# Patient Record
Sex: Female | Born: 1985 | Race: Black or African American | Hispanic: No | Marital: Single | Smoking: Former smoker
Health system: Southern US, Community
[De-identification: ages and names within clinical notes are randomized; demographics above are authoritative.]

## PROBLEM LIST (undated history)

## (undated) ENCOUNTER — Ambulatory Visit: Admission: EM | Payer: Self-pay

## (undated) ENCOUNTER — Inpatient Hospital Stay (HOSPITAL_COMMUNITY): Payer: Self-pay

## (undated) DIAGNOSIS — N760 Acute vaginitis: Secondary | ICD-10-CM

## (undated) DIAGNOSIS — D649 Anemia, unspecified: Secondary | ICD-10-CM

## (undated) DIAGNOSIS — B9689 Other specified bacterial agents as the cause of diseases classified elsewhere: Secondary | ICD-10-CM

## (undated) HISTORY — PX: NO PAST SURGERIES: SHX2092

---
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
## 2006-05-07 ENCOUNTER — Emergency Department: Payer: Self-pay

## 2006-08-30 ENCOUNTER — Emergency Department: Payer: Self-pay | Admitting: Emergency Medicine

## 2007-01-20 ENCOUNTER — Emergency Department: Payer: Self-pay | Admitting: Emergency Medicine

## 2007-05-15 ENCOUNTER — Emergency Department: Payer: Self-pay | Admitting: Emergency Medicine

## 2007-08-23 ENCOUNTER — Emergency Department (HOSPITAL_COMMUNITY): Admission: EM | Admit: 2007-08-23 | Discharge: 2007-08-23 | Payer: Self-pay | Admitting: Emergency Medicine

## 2007-11-12 ENCOUNTER — Emergency Department (HOSPITAL_COMMUNITY): Admission: EM | Admit: 2007-11-12 | Discharge: 2007-11-12 | Payer: Self-pay | Admitting: Emergency Medicine

## 2008-02-18 ENCOUNTER — Emergency Department: Payer: Self-pay | Admitting: Emergency Medicine

## 2008-04-21 ENCOUNTER — Ambulatory Visit: Payer: Self-pay | Admitting: Family Medicine

## 2008-08-26 ENCOUNTER — Inpatient Hospital Stay: Payer: Self-pay

## 2008-09-04 ENCOUNTER — Inpatient Hospital Stay (HOSPITAL_COMMUNITY): Admission: AD | Admit: 2008-09-04 | Discharge: 2008-09-06 | Payer: Self-pay | Admitting: Obstetrics & Gynecology

## 2008-09-12 ENCOUNTER — Ambulatory Visit: Payer: Self-pay | Admitting: Obstetrics & Gynecology

## 2008-09-25 ENCOUNTER — Ambulatory Visit: Payer: Self-pay | Admitting: Family Medicine

## 2008-12-15 ENCOUNTER — Emergency Department (HOSPITAL_COMMUNITY): Admission: EM | Admit: 2008-12-15 | Discharge: 2008-12-15 | Payer: Self-pay | Admitting: Emergency Medicine

## 2009-01-18 ENCOUNTER — Emergency Department (HOSPITAL_COMMUNITY): Admission: EM | Admit: 2009-01-18 | Discharge: 2009-01-18 | Payer: Self-pay | Admitting: Emergency Medicine

## 2009-04-21 ENCOUNTER — Emergency Department (HOSPITAL_COMMUNITY): Admission: EM | Admit: 2009-04-21 | Discharge: 2009-04-22 | Payer: Self-pay | Admitting: Emergency Medicine

## 2009-06-17 ENCOUNTER — Emergency Department (HOSPITAL_COMMUNITY): Admission: EM | Admit: 2009-06-17 | Discharge: 2009-06-17 | Payer: Self-pay | Admitting: Emergency Medicine

## 2009-08-26 ENCOUNTER — Emergency Department (HOSPITAL_COMMUNITY): Admission: EM | Admit: 2009-08-26 | Discharge: 2009-08-26 | Payer: Self-pay | Admitting: Emergency Medicine

## 2009-12-02 ENCOUNTER — Emergency Department (HOSPITAL_COMMUNITY): Admission: EM | Admit: 2009-12-02 | Discharge: 2009-12-02 | Payer: Self-pay | Admitting: Family Medicine

## 2010-04-06 LAB — URINALYSIS, ROUTINE W REFLEX MICROSCOPIC
Glucose, UA: NEGATIVE mg/dL
Nitrite: NEGATIVE
Specific Gravity, Urine: 1.015 (ref 1.005–1.030)
pH: 6.5 (ref 5.0–8.0)

## 2010-04-06 LAB — POCT PREGNANCY, URINE: Preg Test, Ur: NEGATIVE

## 2010-04-09 LAB — URINALYSIS, ROUTINE W REFLEX MICROSCOPIC
Nitrite: NEGATIVE
Protein, ur: NEGATIVE mg/dL
Specific Gravity, Urine: >1.03 — ABNORMAL HIGH (ref 1.005–1.030)
Urobilinogen, UA: 0.2 mg/dL (ref 0.0–1.0)

## 2010-04-09 LAB — WET PREP, GENITAL: Yeast Wet Prep HPF POC: NONE SEEN

## 2010-04-09 LAB — POCT PREGNANCY, URINE: Preg Test, Ur: NEGATIVE

## 2010-04-09 LAB — GC/CHLAMYDIA PROBE AMP, GENITAL: GC Probe Amp, Genital: NEGATIVE

## 2010-04-12 LAB — WET PREP, GENITAL: Trich, Wet Prep: NONE SEEN

## 2010-04-12 LAB — URINE MICROSCOPIC-ADD ON

## 2010-04-12 LAB — URINALYSIS, ROUTINE W REFLEX MICROSCOPIC
Glucose, UA: NEGATIVE mg/dL
Hgb urine dipstick: NEGATIVE
Specific Gravity, Urine: 1.016 (ref 1.005–1.030)
Urobilinogen, UA: 1 mg/dL (ref 0.0–1.0)

## 2010-04-12 LAB — POCT PREGNANCY, URINE: Preg Test, Ur: NEGATIVE

## 2010-04-12 LAB — GC/CHLAMYDIA PROBE AMP, GENITAL: GC Probe Amp, Genital: NEGATIVE

## 2010-04-19 LAB — URINE CULTURE: Culture: NO GROWTH

## 2010-04-19 LAB — URINALYSIS, ROUTINE W REFLEX MICROSCOPIC
Glucose, UA: NEGATIVE mg/dL
Ketones, ur: NEGATIVE mg/dL
Nitrite: NEGATIVE
Protein, ur: NEGATIVE mg/dL
Urobilinogen, UA: 0.2 mg/dL (ref 0.0–1.0)

## 2010-04-19 LAB — URINE MICROSCOPIC-ADD ON

## 2010-04-19 LAB — GC/CHLAMYDIA PROBE AMP, GENITAL: Chlamydia, DNA Probe: POSITIVE — AB

## 2010-04-19 LAB — WET PREP, GENITAL
Clue Cells Wet Prep HPF POC: NONE SEEN
Trich, Wet Prep: NONE SEEN
Yeast Wet Prep HPF POC: NONE SEEN

## 2010-04-19 LAB — POCT PREGNANCY, URINE: Preg Test, Ur: NEGATIVE

## 2010-04-20 ENCOUNTER — Emergency Department (HOSPITAL_COMMUNITY)
Admission: EM | Admit: 2010-04-20 | Discharge: 2010-04-20 | Disposition: A | Discharge status: Home / Self Care | Payer: Medicaid Other | Attending: Emergency Medicine | Admitting: Emergency Medicine

## 2010-04-20 DIAGNOSIS — B9689 Other specified bacterial agents as the cause of diseases classified elsewhere: Secondary | ICD-10-CM | POA: Insufficient documentation

## 2010-04-20 DIAGNOSIS — N760 Acute vaginitis: Secondary | ICD-10-CM | POA: Insufficient documentation

## 2010-04-20 DIAGNOSIS — A499 Bacterial infection, unspecified: Secondary | ICD-10-CM | POA: Insufficient documentation

## 2010-04-20 LAB — WET PREP, GENITAL: Trich, Wet Prep: NONE SEEN

## 2010-04-21 LAB — GC/CHLAMYDIA PROBE AMP, GENITAL: Chlamydia, DNA Probe: NEGATIVE

## 2010-04-26 LAB — POCT PREGNANCY, URINE: Preg Test, Ur: NEGATIVE

## 2010-04-26 LAB — GC/CHLAMYDIA PROBE AMP, GENITAL: GC Probe Amp, Genital: NEGATIVE

## 2010-04-26 LAB — WET PREP, GENITAL: Trich, Wet Prep: NONE SEEN

## 2010-04-28 LAB — URINE MICROSCOPIC-ADD ON

## 2010-04-28 LAB — URINALYSIS, ROUTINE W REFLEX MICROSCOPIC
Bilirubin Urine: NEGATIVE
Glucose, UA: NEGATIVE mg/dL
Ketones, ur: NEGATIVE mg/dL
Nitrite: POSITIVE — AB
Protein, ur: NEGATIVE mg/dL
Specific Gravity, Urine: 1.015 (ref 1.005–1.030)
Urobilinogen, UA: 0.2 mg/dL (ref 0.0–1.0)
pH: 5.5 (ref 5.0–8.0)

## 2010-04-28 LAB — URINE CULTURE: Colony Count: >=100000

## 2010-04-28 LAB — POCT PREGNANCY, URINE: Preg Test, Ur: NEGATIVE

## 2010-05-02 LAB — CBC
HCT: 33.1 % — ABNORMAL LOW (ref 36.0–46.0)
HCT: 35.3 % — ABNORMAL LOW (ref 36.0–46.0)
Hemoglobin: 11.2 g/dL — ABNORMAL LOW (ref 12.0–15.0)
Hemoglobin: 11.7 g/dL — ABNORMAL LOW (ref 12.0–15.0)
MCHC: 33.7 g/dL (ref 30.0–36.0)
Platelets: 276 10*3/uL (ref 150–400)
RBC: 4.38 MIL/uL (ref 3.87–5.11)
RDW: 16 % — ABNORMAL HIGH (ref 11.5–15.5)
RDW: 16.3 % — ABNORMAL HIGH (ref 11.5–15.5)
WBC: 7.7 10*3/uL (ref 4.0–10.5)

## 2010-05-02 LAB — URINE MICROSCOPIC-ADD ON
RBC / HPF: NONE SEEN RBC/hpf (ref <3)
WBC, UA: NONE SEEN WBC/hpf (ref <3)

## 2010-05-02 LAB — COMPREHENSIVE METABOLIC PANEL
ALT: 24 U/L (ref 0–35)
Albumin: 2.7 g/dL — ABNORMAL LOW (ref 3.5–5.2)
Alkaline Phosphatase: 109 U/L (ref 39–117)
BUN: 5 mg/dL — ABNORMAL LOW (ref 6–23)
BUN: 7 mg/dL (ref 6–23)
Calcium: 7.7 mg/dL — ABNORMAL LOW (ref 8.4–10.5)
Chloride: 106 mEq/L (ref 96–112)
Glucose, Bld: 84 mg/dL (ref 70–99)
Glucose, Bld: 90 mg/dL (ref 70–99)
Potassium: 3.8 mEq/L (ref 3.5–5.1)
Sodium: 138 mEq/L (ref 135–145)
Sodium: 140 mEq/L (ref 135–145)
Total Bilirubin: 0.3 mg/dL (ref 0.3–1.2)
Total Protein: 6.1 g/dL (ref 6.0–8.3)
Total Protein: 6.4 g/dL (ref 6.0–8.3)

## 2010-05-02 LAB — DIFFERENTIAL
Lymphs Abs: 1.4 10*3/uL (ref 0.7–4.0)
Monocytes Absolute: 0.5 10*3/uL (ref 0.1–1.0)
Monocytes Relative: 7 % (ref 3–12)
Neutro Abs: 5.3 10*3/uL (ref 1.7–7.7)
Neutrophils Relative %: 73 % (ref 43–77)

## 2010-05-02 LAB — URINALYSIS, ROUTINE W REFLEX MICROSCOPIC
Bilirubin Urine: NEGATIVE
Glucose, UA: NEGATIVE mg/dL
Specific Gravity, Urine: <1.005 — ABNORMAL LOW (ref 1.005–1.030)
Urobilinogen, UA: 0.2 mg/dL (ref 0.0–1.0)
pH: 5.5 (ref 5.0–8.0)

## 2010-05-02 LAB — LACTATE DEHYDROGENASE: LDH: 261 U/L — ABNORMAL HIGH (ref 94–250)

## 2010-06-08 NOTE — Discharge Summary (Signed)
Brianna Ali, Brianna Ali                  ACCOUNT NO.:  [id number]   MEDICAL RECORD NO.:  [id number]          PATIENT TYPE:  INP   LOCATION:  9372                          FACILITY:  [facility]   PHYSICIAN:  Horton Chin, MD DATE OF BIRTH:  1985-05-20   DATE OF ADMISSION:  09/04/2008  DATE OF DISCHARGE:  09/06/2008                               DISCHARGE SUMMARY   ADMISSION DIAGNOSES:  1. Postpartum day #7, status post normal spontaneous vaginal delivery      after uncomplicated pregnancy.  2. Elevated blood pressures with headaches concerning for postpartum      preeclampsia.   DISCHARGE DIAGNOSIS:  Postpartum preeclampsia.   PERTINENT LABS:  The patient had a normal comprehensive metabolic panel,  normal LFTs, and normal complete blood count.   BRIEF HOSPITAL COURSE:  The patient is a 25 year old gravida 1, para 1,  status post spontaneous vaginal delivery at [facility]  on August 28, 2008.  The patient does live with her mother here in  [place].  She presented with progressively worsening headache and  took a blood pressure at home and noted that it was in 170s for the  systolic value.  Given the symptoms, she came to [facility] for evaluation, and  in [facility]U, she was noted to still have elevated blood pressures concerning  for postpartum preeclampsia.  The patient was started on an  antihypertensive regimen and admitted for magnesium sulfate eclampsia  prophylaxis and observation.  On the magnesium sulfate and initially on  labetalol 200 mg b.i.d. her blood pressures were stable.  She had no  signs or symptoms of worsening preeclampsia and her labs remained  stable.  Her magnesium sulfate was discontinued after 24 hours and her  blood pressures increased slightly to 140s/90s to 100, so her labetalol  was increased to 400 mg b.i.d. and hydrochlorothiazide 25 mg p.o. daily  was added.  The patient remained stable.  Her blood pressures were below  the severe range and she had no  further symptoms concerning for  worsening preeclampsia.  The patient was considered stable for discharge  to home.   DISCHARGE MEDICATIONS:  1. Labetalol 400 mg p.o. b.i.d.  2. Hydrochlorothiazide 25 mg p.o. daily.   DISCHARGE INSTRUCTIONS:  The patient was told to come back to the  emergency room if she has any worsening symptoms.  She will also come to  the [facility] within the week for a blood pressure check  and she was told to expect a call from the clinic regarding the time and  date of her appointment.  Routine postpartum instructions were also  emphasized.      Horton Chin, MD  Electronically Signed     UAA/MEDQ  D:  09/06/2008  T:  09/06/2008  Job:  [id number]

## 2010-07-13 ENCOUNTER — Emergency Department (HOSPITAL_COMMUNITY)
Admission: EM | Admit: 2010-07-13 | Discharge: 2010-07-13 | Disposition: A | Discharge status: Home / Self Care | Payer: Medicaid Other | Attending: Emergency Medicine | Admitting: Emergency Medicine

## 2010-07-13 DIAGNOSIS — N390 Urinary tract infection, site not specified: Secondary | ICD-10-CM | POA: Insufficient documentation

## 2010-07-13 DIAGNOSIS — B3731 Acute candidiasis of vulva and vagina: Secondary | ICD-10-CM | POA: Insufficient documentation

## 2010-07-13 DIAGNOSIS — N899 Noninflammatory disorder of vagina, unspecified: Secondary | ICD-10-CM | POA: Insufficient documentation

## 2010-07-13 DIAGNOSIS — L293 Anogenital pruritus, unspecified: Secondary | ICD-10-CM | POA: Insufficient documentation

## 2010-07-13 DIAGNOSIS — N898 Other specified noninflammatory disorders of vagina: Secondary | ICD-10-CM | POA: Insufficient documentation

## 2010-07-13 DIAGNOSIS — B373 Candidiasis of vulva and vagina: Secondary | ICD-10-CM | POA: Insufficient documentation

## 2010-07-13 LAB — WET PREP, GENITAL
Clue Cells Wet Prep HPF POC: NONE SEEN
Trich, Wet Prep: NONE SEEN

## 2010-07-13 LAB — URINALYSIS, ROUTINE W REFLEX MICROSCOPIC
Bilirubin Urine: NEGATIVE
Ketones, ur: NEGATIVE mg/dL
Nitrite: NEGATIVE
Protein, ur: NEGATIVE mg/dL
Urobilinogen, UA: 0.2 mg/dL (ref 0.0–1.0)
pH: 6 (ref 5.0–8.0)

## 2010-07-13 LAB — URINE MICROSCOPIC-ADD ON

## 2010-07-13 LAB — POCT PREGNANCY, URINE: Preg Test, Ur: NEGATIVE

## 2010-07-14 LAB — GC/CHLAMYDIA PROBE AMP, GENITAL
Chlamydia, DNA Probe: NEGATIVE
GC Probe Amp, Genital: NEGATIVE

## 2010-09-16 ENCOUNTER — Emergency Department (HOSPITAL_COMMUNITY)
Admission: EM | Admit: 2010-09-16 | Discharge: 2010-09-16 | Disposition: A | Discharge status: Home / Self Care | Payer: Medicaid Other | Attending: Emergency Medicine | Admitting: Emergency Medicine

## 2010-09-16 DIAGNOSIS — R21 Rash and other nonspecific skin eruption: Secondary | ICD-10-CM | POA: Insufficient documentation

## 2010-10-22 LAB — URINE MICROSCOPIC-ADD ON

## 2010-10-22 LAB — URINALYSIS, ROUTINE W REFLEX MICROSCOPIC
Bilirubin Urine: NEGATIVE
Glucose, UA: NEGATIVE
Hgb urine dipstick: NEGATIVE
Ketones, ur: NEGATIVE
Leukocytes, UA: NEGATIVE
Nitrite: NEGATIVE
Protein, ur: 30 — AB
Specific Gravity, Urine: 1.019
Urobilinogen, UA: 0.2
pH: 5.5

## 2010-10-22 LAB — WET PREP, GENITAL: Yeast Wet Prep HPF POC: NONE SEEN

## 2010-10-22 LAB — POCT PREGNANCY, URINE: Preg Test, Ur: NEGATIVE

## 2010-10-22 LAB — SYPHILIS: RPR W/REFLEX TO RPR TITER AND TREPONEMAL ANTIBODIES, TRADITIONAL SCREENING AND DIAGNOSIS ALGORITHM: RPR Ser Ql: NONREACTIVE

## 2010-10-22 LAB — GC/CHLAMYDIA PROBE AMP, GENITAL: GC Probe Amp, Genital: NEGATIVE

## 2010-10-26 LAB — URINALYSIS, ROUTINE W REFLEX MICROSCOPIC
Bilirubin Urine: NEGATIVE
Nitrite: NEGATIVE
Specific Gravity, Urine: 1.021
Urobilinogen, UA: 0.2

## 2010-10-26 LAB — WET PREP, GENITAL

## 2010-10-26 LAB — URINE MICROSCOPIC-ADD ON

## 2010-10-26 LAB — GC/CHLAMYDIA PROBE AMP, GENITAL: GC Probe Amp, Genital: NEGATIVE

## 2010-10-26 LAB — POCT PREGNANCY, URINE: Preg Test, Ur: NEGATIVE

## 2011-10-20 ENCOUNTER — Emergency Department (HOSPITAL_COMMUNITY)
Admission: EM | Admit: 2011-10-20 | Discharge: 2011-10-20 | Disposition: A | Discharge status: Home / Self Care | Payer: Medicaid Other | Attending: Emergency Medicine | Admitting: Emergency Medicine

## 2011-10-20 ENCOUNTER — Encounter (HOSPITAL_COMMUNITY): Payer: Self-pay | Admitting: Emergency Medicine

## 2011-10-20 DIAGNOSIS — A5901 Trichomonal vulvovaginitis: Secondary | ICD-10-CM | POA: Insufficient documentation

## 2011-10-20 DIAGNOSIS — N760 Acute vaginitis: Secondary | ICD-10-CM

## 2011-10-20 DIAGNOSIS — B9689 Other specified bacterial agents as the cause of diseases classified elsewhere: Secondary | ICD-10-CM | POA: Insufficient documentation

## 2011-10-20 DIAGNOSIS — A499 Bacterial infection, unspecified: Secondary | ICD-10-CM | POA: Insufficient documentation

## 2011-10-20 DIAGNOSIS — A599 Trichomoniasis, unspecified: Secondary | ICD-10-CM

## 2011-10-20 LAB — URINALYSIS, ROUTINE W REFLEX MICROSCOPIC
Bilirubin Urine: NEGATIVE
Nitrite: NEGATIVE
Specific Gravity, Urine: 1.016 (ref 1.005–1.030)
pH: 8 (ref 5.0–8.0)

## 2011-10-20 LAB — URINE MICROSCOPIC-ADD ON

## 2011-10-20 LAB — POCT PREGNANCY, URINE: Preg Test, Ur: NEGATIVE

## 2011-10-20 MED ORDER — METRONIDAZOLE 500 MG PO TABS
2000.0000 mg | ORAL_TABLET | Freq: Once | ORAL | Status: AC
Start: 2011-10-20 — End: 2011-10-20
  Administered 2011-10-20: 2000 mg via ORAL
  Filled 2011-10-20: qty 4

## 2011-10-20 NOTE — ED Notes (Signed)
Pt sts went to health department and was told she has trichomonas and BV and was given clyndamicin; pt was told that they could not treat her trichomonas because she said she was allergic to flagyl but pt sts she does not like the taste of flagyl and is not allergic to it and she wants to have her trichomonas treated.

## 2011-10-20 NOTE — ED Notes (Signed)
Pt is here to be treated for trichomonas, was told that she had it from the Health Department but wasn't given the medication to treat it there because she reported she had an allergy to it but now states she doesn't have an allergy, just doesn't like how it tastes.

## 2011-10-20 NOTE — ED Provider Notes (Signed)
History  Scribed for Glynn Octave, MD, the patient was seen in room TR08C/TR08C. This chart was scribed by Candelaria Stagers. The patient's care started at 7:07 PM    CSN: [id number]  Arrival date & time 10/20/11  1759   First MD Initiated Contact with Patient 10/20/11 1820      Chief Complaint  Patient presents with  . Medication Refill     The history is provided by the patient. No language interpreter was used.   Brianna Ali is a 26 y.o. female who presents to the Emergency Department for need of medication to treat trichomonas and BV that she was dx with yesterday at the health department.  Pt was not able to receive her medication at that time, she reported that she was allergic to the medication because she did not like the taste. She denies abdominal pain, fever, or vomiting.  She had a pelvic exam yesterday.    History reviewed. No pertinent past medical history.  History reviewed. No pertinent past surgical history.  History reviewed. No pertinent family history.  History  Substance Use Topics  . Smoking status: Current Every Day Smoker  . Smokeless tobacco: Not on file  . Alcohol Use: Yes    OB History    Grav Para Term Preterm Abortions TAB SAB Ect Mult Living                  Review of Systems A complete 10 system review of systems was obtained and all systems are negative except as noted in the HPI and PMH.   Allergies  Ciprofloxacin  Home Medications  No current outpatient prescriptions on file.  BP 129/75  Pulse 103  Temp 98.4 F (36.9 C) (Oral)  Resp 18  SpO2 98%  Physical Exam  Nursing note and vitals reviewed. Constitutional: She is oriented to person, place, and time. She appears well-developed and well-nourished. No distress.  HENT:  Head: Normocephalic and atraumatic.  Eyes: EOM are normal. Pupils are equal, round, and reactive to light.  Neck: Neck supple. No tracheal deviation present.  Cardiovascular: Normal rate.     Pulmonary/Chest: Effort normal. No respiratory distress.  Abdominal: Soft. She exhibits no distension. There is no tenderness.       No CVA tenderness.   Musculoskeletal: Normal range of motion. She exhibits no edema.  Neurological: She is alert and oriented to person, place, and time. No sensory deficit.  Skin: Skin is warm and dry.  Psychiatric: She has a normal mood and affect. Her behavior is normal.    ED Course  Procedures   DIAGNOSTIC STUDIES: Oxygen Saturation is 98% on room air, normal by my interpretation.    COORDINATION OF CARE:   18:10 Ordered: POCT Pregnancy, Urine; Urinalysis, Routine w reflex microscopic   Labs Reviewed  POCT PREGNANCY, URINE  URINALYSIS, ROUTINE W REFLEX MICROSCOPIC   No results found.   No diagnosis found.    MDM  Seen at health department yesterday for vaginal discharge and told she had Trichomonas and BV. She refused Flagyl stating she was allergic. Now she requests Flagyl stating she is actually not allergic but just doesn't like the taste.  Denies abdominal pain, fever, vomiting, vaginal bleeding, urinary symptoms.  Yesterday's pelvic exam not repeated. Abdomen soft and nontender. Will treat empirically for BV and trich.  HCG negative. I personally performed the services described in this documentation, which was scribed in my presence.  The recorded information has been reviewed and considered.  Glynn Octave, MD 10/20/11 1907

## 2013-11-07 ENCOUNTER — Encounter (HOSPITAL_COMMUNITY): Payer: Self-pay | Admitting: Emergency Medicine

## 2013-11-07 ENCOUNTER — Emergency Department (HOSPITAL_COMMUNITY)
Admission: EM | Admit: 2013-11-07 | Discharge: 2013-11-07 | Disposition: A | Discharge status: Home / Self Care | Payer: Medicaid Other | Attending: Emergency Medicine | Admitting: Emergency Medicine

## 2013-11-07 DIAGNOSIS — N760 Acute vaginitis: Secondary | ICD-10-CM | POA: Insufficient documentation

## 2013-11-07 DIAGNOSIS — N939 Abnormal uterine and vaginal bleeding, unspecified: Secondary | ICD-10-CM | POA: Insufficient documentation

## 2013-11-07 DIAGNOSIS — Z720 Tobacco use: Secondary | ICD-10-CM | POA: Insufficient documentation

## 2013-11-07 DIAGNOSIS — B9689 Other specified bacterial agents as the cause of diseases classified elsewhere: Secondary | ICD-10-CM

## 2013-11-07 DIAGNOSIS — Z3202 Encounter for pregnancy test, result negative: Secondary | ICD-10-CM | POA: Insufficient documentation

## 2013-11-07 LAB — URINALYSIS, ROUTINE W REFLEX MICROSCOPIC
Bilirubin Urine: NEGATIVE
Glucose, UA: NEGATIVE mg/dL
Hgb urine dipstick: NEGATIVE
Ketones, ur: NEGATIVE mg/dL
LEUKOCYTES UA: NEGATIVE
NITRITE: NEGATIVE
PROTEIN: NEGATIVE mg/dL
SPECIFIC GRAVITY, URINE: 1.017 (ref 1.005–1.030)
UROBILINOGEN UA: 0.2 mg/dL (ref 0.0–1.0)
pH: 5.5 (ref 5.0–8.0)

## 2013-11-07 LAB — WET PREP, GENITAL
Trich, Wet Prep: NONE SEEN
Yeast Wet Prep HPF POC: NONE SEEN

## 2013-11-07 LAB — PREGNANCY, URINE: PREG TEST UR: NEGATIVE

## 2013-11-07 MED ORDER — CLINDAMYCIN HCL 150 MG PO CAPS: 300.0000 mg | ORAL_CAPSULE | Freq: Two times a day (BID) | ORAL | Status: DC

## 2013-11-07 NOTE — ED Notes (Signed)
MD at bedside. 

## 2013-11-07 NOTE — ED Notes (Signed)
Pt states for past 2 months, bright orange vaginal bleeding between periods, only apparent when wiping on toilet paper. Vaginal bleeding not significant enough to soil underwear. Pt also c/o vaginal discharge.

## 2013-11-07 NOTE — Discharge Instructions (Signed)
°Emergency Department Resource Guide °1) Find a Doctor and Pay Out of Pocket °Although you won't have to find out who is covered by your insurance plan, it is a good idea to ask around and get recommendations. You will then need to call the office and see if the doctor you have chosen will accept you as a new patient and what types of options they offer for patients who are self-pay. Some doctors offer discounts or will set up payment plans for their patients who do not have insurance, but you will need to ask so you aren't surprised when you get to your appointment. ° °2) Contact Your Local Health Department °Not all health departments have doctors that can see patients for sick visits, but many do, so it is worth a call to see if yours does. If you don't know where your local health department is, you can check in your phone book. The CDC also has a tool to help you locate your state's health department, and many state websites also have listings of all of their local health departments. ° °3) Find a Walk-in Clinic °If your illness is not likely to be very severe or complicated, you may want to try a walk in clinic. These are popping up all over the country in pharmacies, drugstores, and shopping centers. They're usually staffed by nurse practitioners or physician assistants that have been trained to treat common illnesses and complaints. They're usually fairly quick and inexpensive. However, if you have serious medical issues or chronic medical problems, these are probably not your best option. ° °No Primary Care Doctor: °- Call Health Connect at  832-8000 - they can help you locate a primary care doctor that  accepts your insurance, provides certain services, etc. °- Physician Referral Service- 1-800-533-3463 ° °Chronic Pain Problems: °Organization         Address  Phone   Notes  °[facility]  (336) 297-2271 Patients need to be referred by their primary care doctor.  ° °Medication  Assistance: °Organization         Address  Phone   Notes  °[place] [facility] [address] °[place], [place] [address] (336) 641-8030 --Must be a resident of [place] °-- Must have NO insurance coverage whatsoever (no Medicaid/ Medicare, etc.) °-- The pt. MUST have a primary care doctor that directs their care regularly and follows them in the community °  °[facility]  (866) 331-1348   °[employer]  (888) 892-1162   ° °Agencies that provide inexpensive medical care: °Organization         Address  Phone   Notes  °[facility]  (336) 832-8035   °[facility]    (336) 832-7272   °[facility] [address] °[place], [place] [address] (336) 832-4777   °[facility] [address], °[place] (336) 271-4999   °[facility]    (336) 373-0678   °[facility]    (336) 272-1050   °[facility] ° [address], [place] Phone:  (336) 832-4444, Fax:  (336) 832-4440 Hours of Operation:  9 am - 6 pm, M-F.  Also accepts Medicaid/Medicare and self-pay.  °[facility] ° [address], [place] Phone: (336) 832-3150, Fax: (336) [phone]. Hours of Operation:  8:30 am - 5:30 pm, M-F.  Also accepts Medicaid and self-pay.  °[facility] [place] [address], [place] Phone: (336) 878-6027   °[facility] [address], [place], [place] (336)723-1848, Ext. 123 Mondays & Thursdays: 7-9 AM.  First 15 patients are seen on a first come, first serve basis. °  ° °Medicaid-accepting [place] Providers: ° °Organization         Address  Phone   Notes  °[facility] [address], [place] (336) 641-2100 Also accepts self-pay patients.  °[facility] [address], [place] ° (336) 856-9996   °[facility] [address], [place]  (336) 288-8857   °[facility] [address] [place] [address], [place] (336) 299-7000   °Veita Bland [address], [place]  ° (336) 373-1557 Only accepts [place] Access Medicaid patients after they have their name applied to their card.  ° °Self-Pay (no insurance) in [place]: ° °Organization         Address  Phone   Notes  °Sickle Cell Patients, [facility] [address], [place] (336) 832-1970   °[facility] [address], [place] (336) 832-4400   °[facility] ° [address], [place] (336) 992-4800   °[facility]/Dr. Osei-Bonsu ° [address] [place] [address], [place] or [address], [place] (336) 841-8500 Phone number for both [place] and [place] locations is the same.  °[facility] [address], [place] (336) 299-0000   °[facility] [address] [place] [address], [place] or [address] (336) 852-7530 °(336) 878-2260   °[facility] [address], [place] (336) 350-1642, phone; (336) 294-5005, fax Sees patients 1st and 3rd Saturday of every month.  Must not qualify for public or private insurance (i.e. Medicaid, Medicare, [place] Health Choice, Veterans' Benefits) • Household income should be no more than 200% of the poverty level •The clinic cannot treat you if you are pregnant or think you are pregnant • Sexually transmitted diseases are not treated at the clinic.  ° ° °Dental Care: °Organization         Address  Phone  Notes  °[place] [facility] [address], [place] (336) 641-6152 Accepts children up to age 21 who are enrolled in Medicaid or [place] Health Choice; pregnant women with a Medicaid card; and children who have applied for Medicaid or [place] Health Choice, but were declined, whose parents can pay a reduced fee at time of service.  °[place]  [facility] [place]  [address], [place] (336) 641-7733 Accepts children up to age 21 who are enrolled in Medicaid or [place] Health Choice; pregnant women with a Medicaid card; and children who have applied for Medicaid or [place] Health Choice, but were declined, whose parents can pay a reduced fee at time of service.  °[facility] ° [address], [place] (336) 641-4533 Patients are seen by appointment only. Walk-ins are not accepted. [facility] will see patients 18 years of age and older. °Monday - Tuesday (8am-5pm) °Most Wednesdays (8:30-5pm) °$30 per visit, cash only  °[facility] ° [address], [place] (336) 641-4533 Patients are seen by appointment only. Walk-ins are not accepted. [facility] will see patients 18 years of age and older. °One   Wednesday Evening (Monthly: Volunteer Based).  $30 per visit, cash only  °[facility]  (919) 537-3737 for adults; Children under age 4, call Graduate Pediatric Dentistry at ([phone]) [phone]. Children aged 4-14, please call ([phone]) [phone] to request a pediatric application. ° Dental services are provided in all areas of dental care including fillings, crowns and bridges, complete and partial dentures, implants, gum treatment, root canals, and extractions. Preventive care is also provided. Treatment is provided to both adults and children. °Patients are selected via a lottery and there is often a waiting list. °  °[facility] [address], °[place] ° (336) 763-8833 [email or web address] °  °[facility] [address], [place], [place] (336)723-1848, Ext. 123 Second and Fourth Thursday of each month, opens at 6:30 AM; Clinic ends at 9 AM.  Patients are seen on a first-come first-served basis, and a limited number are seen during each clinic.  ° °[facility] ° [address], [place], [place] (336) 723-7904    Eligibility Requirements °You must have lived in [place], [place], or [place] counties for at least the last three months. °  You cannot be eligible for state or federal sponsored healthcare insurance, including Veterans Administration, Medicaid, or Medicare. °  You generally cannot be eligible for healthcare insurance through your employer.  °  How to apply: °Eligibility screenings are held every Tuesday and Wednesday afternoon from 1:00 pm until 4:00 pm. You do not need an appointment for the interview!  °[facility] [address], [place], [place] 336-631-2330   °[place] [facility]  336-342-8273   °[place]  336-703-3100   °[facility]  336-570-6415   ° °Behavioral Health Resources in the Community: °Intensive Outpatient Programs °Organization         Address  Phone  Notes  °[facility] [address], [place], [place] 336-878-6098   °[facility] [address], [place], [place] 336-832-9800   °[facility] [address], [place], [place] ° 336-882-2125   °[facility] [address],  °[place], [place] 1-800-853-5163 or 336-641-4981   °Substance Abuse Resources °Organization         Address  Phone  Notes  °[facility]  336-882-2125   °[facility]  336-784-9470   °[facility]  336-285-9073   °[facility]  336-845-3988   °Residential & Outpatient Substance Abuse Program  1-800-659-3381   °Psychological Services °Organization         Address  Phone  Notes  °[facility]  336- 832-9600   °[facility]  336- 378-7881   °[facility] [address], [place] 1-800-853-5163 or 336-641-4981   ° °Mobile Crisis Teams °Organization         Address  Phone  Notes  °[facility], Mobile Crisis Care Unit  1-877-626-1772   °Assertive °[facility] ° [address]  [place], [place] 336-834-9664   °Sharon DeEsch [address] °[place] 336-554-5454   ° °Self-Help/Support Groups °Organization         Address  Phone             Notes  °[facility] of  - variety of support groups  336- 373-1402 Call for more information  °Narcotics Anonymous (NA), [facility] [address], °[place]  2 meetings at this location  ° °  Residential Treatment Programs Organization         Address  Phone  Notes  [facility] [address],    [place]  1-610-960-45401-229-635-2621   [facility]  [address], [place] [phone], [place], [place] [phone](860)[phone]   [facility] [address], [place] [phone] Admissions: 8am-3pm M-F  [facility] [address]    [place], [place] 295-284-1324(407)[phone]   [facility] [address], [place], [place] [phone]   [facility] [address]  [place], [place] [phone]   [facility] - Intensive Outpatient [address], [place], [place] [phone]   [facility] ([facility] [address]  [place], [place] [phone] or [phone]   [facility] [address] [place], [place] 323-557-3220(475)[phone] Accepts Medicaid  [facility] [address]  [place] 2-542-706-23761-(717) [phone] Substance Abuse/Addiction Treatment   [facility]         Address  Phone  Notes  [facility]  3081932759(888) 972-606-6913   Angie FavaJulie Brannon, PhD [address], [address] [place], [place]   832-383-6588(336) 216-255-3582 or (435) [phone](336) ([phone]   [facility]   [address] [place], [place] [phone](336) ([phone]   [facility] [address], [place], [place] (775)112-8262(336) [phone] Insurance/Medicaid/sponsorship through [facility] [address]                                    [place], [place] ([phone](336) [phone] Therapy/tele-psych/case    [facility] [address]   [place], [place] [phone](336) [phone]    Dr. Lolly MustacheArfeen  [phone]) [phone]   [facility]  [employer] [facility] 1) [address], [place] 2) [address], [place] 3)  [address], [place] ([phone](336) [phone] ([phone])[phone](336) [phone]  ([phone]) [phone](336) [phone]   [facility] Child Abuse Hotline 581-022-2578(336) [phone] or (727)[phone](336) (607)[phone] (After Hours)       Take the prescription as directed.  Call your regular OB/GYN doctor today to schedule a follow up appointment within the next week.  Return to the Emergency Department immediately sooner if worsening.

## 2013-11-07 NOTE — ED Provider Notes (Signed)
CSN: [id number]     Arrival date & time 11/07/13  [id number] History   First MD Initiated Contact with Patient 11/07/13 0831     Chief Complaint  Patient presents with  . Vaginal Discharge  . Vaginal Bleeding      HPI Pt was seen at 0835. Per pt, c/o gradual onset and persistence of constant vaginal discharge for the past 1 to 2 months. Has been associated with intermittent vaginal bleeding; described as seeing blood on the toilet paper when wiping in between her periods. Denies pelvic pain, no dysuria/hematuria, no flank pain, no N/V/D, no fevers, no rash.    History reviewed. No pertinent past medical history.  History reviewed. No pertinent past surgical history.  History  Substance Use Topics  . Smoking status: Current Every Day Smoker  . Smokeless tobacco: Not on file  . Alcohol Use: Yes    Review of Systems ROS: Statement: All systems negative except as marked or noted in the HPI; Constitutional: Negative for fever and chills. ; ; Eyes: Negative for eye pain, redness and discharge. ; ; ENMT: Negative for ear pain, hoarseness, nasal congestion, sinus pressure and sore throat. ; ; Cardiovascular: Negative for chest pain, palpitations, diaphoresis, dyspnea and peripheral edema. ; ; Respiratory: Negative for cough, wheezing and stridor. ; ; Gastrointestinal: Negative for nausea, vomiting, diarrhea, abdominal pain, blood in stool, hematemesis, jaundice and rectal bleeding. . ; ; Genitourinary: Negative for dysuria, flank pain and hematuria. ; ; GYN:  +vaginal bleeding, +vaginal discharge, no vulvar pain. ;; Musculoskeletal: Negative for back pain and neck pain. Negative for swelling and trauma.; ; Skin: Negative for pruritus, rash, abrasions, blisters, bruising and skin lesion.; ; Neuro: Negative for headache, lightheadedness and neck stiffness. Negative for weakness, altered level of consciousness , altered mental status, extremity weakness, paresthesias, involuntary movement, seizure and  syncope.      Allergies  Ciprofloxacin and Flagyl  Home Medications   Prior to Admission medications   Not on File   BP 114/76  Pulse 104  Temp(Src) 97.6 F (36.4 C) (Oral)  Resp 18  SpO2 97% Physical Exam 0840: Physical examination:  Nursing notes reviewed; Vital signs and O2 SAT reviewed;  Constitutional: Well developed, Well nourished, Well hydrated, In no acute distress; Head:  Normocephalic, atraumatic; Eyes: EOMI, PERRL, No scleral icterus; ENMT: Mouth and pharynx normal, Mucous membranes moist; Neck: Supple, Full range of motion, No lymphadenopathy; Cardiovascular: Regular rate and rhythm, No murmur, rub, or gallop; Respiratory: Breath sounds clear & equal bilaterally, No rales, rhonchi, wheezes.  Speaking full sentences with ease, Normal respiratory effort/excursion; Chest: Nontender, Movement normal; Abdomen: Soft, Nontender, Nondistended, Normal bowel sounds; Genitourinary: No CVA tenderness. Pelvic exam performed with permission of pt and female ED tech assist during exam.  External genitalia w/o lesions. Vaginal vault with thin yellow discharge. No blood in vaginal vault. Cervix w/o lesions, not friable, GC/chlam and wet prep obtained and sent to lab.  Bimanual exam w/o CMT, uterine or adnexal tenderness.;; Extremities: Pulses normal, No tenderness, No edema, No calf edema or asymmetry.; Neuro: AA&Ox3, Major CN grossly intact.  Speech clear. No gross focal motor or sensory deficits in extremities.; Skin: Color normal, Warm, Dry.   ED Course  Procedures     MDM  MDM Reviewed: previous chart, nursing note and vitals Interpretation: labs   Results for orders placed during the hospital encounter of 11/07/13  WET PREP, GENITAL      Result Value Ref Range   Yeast Wet Prep HPF  POC NONE SEEN  NONE SEEN   Trich, Wet Prep NONE SEEN  NONE SEEN   Clue Cells Wet Prep HPF POC FEW (*) NONE SEEN   WBC, Wet Prep HPF POC FEW (*) NONE SEEN  URINALYSIS, ROUTINE W REFLEX MICROSCOPIC       Result Value Ref Range   Color, Urine YELLOW  YELLOW   APPearance CLEAR  CLEAR   Specific Gravity, Urine 1.017  1.005 - 1.030   pH 5.5  5.0 - 8.0   Glucose, UA NEGATIVE  NEGATIVE mg/dL   Hgb urine dipstick NEGATIVE  NEGATIVE   Bilirubin Urine NEGATIVE  NEGATIVE   Ketones, ur NEGATIVE  NEGATIVE mg/dL   Protein, ur NEGATIVE  NEGATIVE mg/dL   Urobilinogen, UA 0.2  0.0 - 1.0 mg/dL   Nitrite NEGATIVE  NEGATIVE   Leukocytes, UA NEGATIVE  NEGATIVE  PREGNANCY, URINE      Result Value Ref Range   Preg Test, Ur NEGATIVE  NEGATIVE     0915:  Will tx for BV; f/u with OB/GYN MD. Dx and testing d/w pt and family.  Questions answered.  Verb understanding, agreeable to d/c home with outpt f/u.       Samuel JesterKathleen Kazue Cerro, DO 11/08/13 2018

## 2013-11-08 LAB — GC/CHLAMYDIA PROBE AMP
CT Probe RNA: NEGATIVE
GC Probe RNA: NEGATIVE

## 2015-01-02 ENCOUNTER — Emergency Department (HOSPITAL_COMMUNITY)
Admission: EM | Admit: 2015-01-02 | Discharge: 2015-01-02 | Disposition: A | Discharge status: Home / Self Care | Payer: Medicaid Other | Attending: Emergency Medicine | Admitting: Emergency Medicine

## 2015-01-02 ENCOUNTER — Encounter (HOSPITAL_COMMUNITY): Payer: Self-pay | Admitting: Emergency Medicine

## 2015-01-02 DIAGNOSIS — F17200 Nicotine dependence, unspecified, uncomplicated: Secondary | ICD-10-CM | POA: Insufficient documentation

## 2015-01-02 DIAGNOSIS — B9689 Other specified bacterial agents as the cause of diseases classified elsewhere: Secondary | ICD-10-CM

## 2015-01-02 DIAGNOSIS — Z3202 Encounter for pregnancy test, result negative: Secondary | ICD-10-CM | POA: Insufficient documentation

## 2015-01-02 DIAGNOSIS — N760 Acute vaginitis: Secondary | ICD-10-CM | POA: Insufficient documentation

## 2015-01-02 DIAGNOSIS — Z792 Long term (current) use of antibiotics: Secondary | ICD-10-CM | POA: Insufficient documentation

## 2015-01-02 HISTORY — DX: Other specified bacterial agents as the cause of diseases classified elsewhere: N76.0

## 2015-01-02 HISTORY — DX: Other specified bacterial agents as the cause of diseases classified elsewhere: B96.89

## 2015-01-02 LAB — WET PREP, GENITAL
Sperm: NONE SEEN
Trich, Wet Prep: NONE SEEN
YEAST WET PREP: NONE SEEN

## 2015-01-02 LAB — I-STAT BETA HCG BLOOD, ED (MC, WL, AP ONLY): I-stat hCG, quantitative: <5 m[IU]/mL (ref <5)

## 2015-01-02 LAB — RPR: RPR: NONREACTIVE

## 2015-01-02 LAB — HIV ANTIBODY (ROUTINE TESTING W REFLEX): HIV Screen 4th Generation wRfx: NONREACTIVE

## 2015-01-02 MED ORDER — CLINDAMYCIN HCL 150 MG PO CAPS: 300.0000 mg | ORAL_CAPSULE | Freq: Two times a day (BID) | ORAL | Status: DC

## 2015-01-02 NOTE — ED Notes (Signed)
Pt c/o vaginal discharge- brownish in color, has an odor, not itching. Has had a hx of BV in past, earlier this yr.

## 2015-01-02 NOTE — ED Notes (Signed)
Pelvic cart set up at bedside; phlebotomist present in room

## 2015-01-02 NOTE — ED Notes (Signed)
Patient undressed, in gown, on continuous pulse oximetry and blood pressure cuff 

## 2015-01-02 NOTE — ED Provider Notes (Signed)
CSN: [id number]     Arrival date & time 01/02/15  0745 History   First MD Initiated Contact with Patient 01/02/15 0750     Chief Complaint  Patient presents with  . Vaginal Discharge     (Consider location/radiation/quality/duration/timing/severity/associated sxs/prior Treatment) The history is provided by the patient.     Pt with hx recurrent BV states she has had 8 days of brown, malodorous, uncomfortable vaginal discharge that feels like prior BV.  LMP was approximately Nov 23, on time but abnormal in the bleeding and cramping pattern she experiences.  She is sexually active but reports 100% of the time uses protection.  Denies fevers, N/V, abdominal pain, urinary or bowel symptoms.  She does not currently have a gynecologist.   Past Medical History  Diagnosis Date  . BV (bacterial vaginosis)    History reviewed. No pertinent past surgical history. No family history on file. Social History  Substance Use Topics  . Smoking status: Current Some Day Smoker    Types: Cigars  . Smokeless tobacco: None  . Alcohol Use: Yes     Comment: socially   OB History    No data available     Review of Systems  Constitutional: Negative for fever.  Gastrointestinal: Negative for nausea, vomiting, abdominal pain and diarrhea.  Genitourinary: Positive for vaginal discharge and vaginal pain. Negative for dysuria, urgency, frequency and menstrual problem.  Musculoskeletal: Negative for myalgias.  Allergic/Immunologic: Negative for immunocompromised state.  Psychiatric/Behavioral: Negative for self-injury.      Allergies  Flagyl and Ciprofloxacin  Home Medications   Prior to Admission medications   Medication Sig Start Date End Date Taking? Authorizing Provider  acetaminophen (TYLENOL) 500 MG tablet Take 1,000 mg by mouth every 6 (six) hours as needed for mild pain or headache.    Historical Provider, MD  clindamycin (CLEOCIN) 150 MG capsule Take 2 capsules (300 mg total) by mouth 2  (two) times daily. For the next 7 days 11/07/13   Samuel Jester, DO   BP 109/77 mmHg  Pulse 81  Temp(Src) 99.1 F (37.3 C) (Oral)  Resp 18  SpO2 100%  LMP 12/11/2014 (Exact Date) Physical Exam  Constitutional: She appears well-developed and well-nourished. No distress.  HENT:  Head: Normocephalic and atraumatic.  Neck: Neck supple.  Pulmonary/Chest: Effort normal.  Abdominal: Soft. She exhibits no distension. There is no tenderness. There is no rebound.  Genitourinary: Uterus is not tender. Cervix exhibits no motion tenderness. Right adnexum displays no mass, no tenderness and no fullness. Left adnexum displays no mass, no tenderness and no fullness. No erythema or tenderness in the vagina. No foreign body around the vagina. Vaginal discharge found.  Brown vaginal discharge with small amount of yellow discharge at the cervix.    Neurological: She is alert.  Skin: She is not diaphoretic.  Nursing note and vitals reviewed.   ED Course  Procedures (including critical care time) Labs Review Labs Reviewed  WET PREP, GENITAL - Abnormal; Notable for the following:    Clue Cells Wet Prep HPF POC PRESENT (*)    WBC, Wet Prep HPF POC MANY (*)    All other components within normal limits  HIV ANTIBODY (ROUTINE TESTING)  RPR  I-STAT BETA HCG BLOOD, ED ([facility], [facility], [facility] ONLY)  GC/CHLAMYDIA PROBE AMP ([place]) NOT AT [facility]    Imaging Review No results found. I have personally reviewed and evaluated these images and lab results as part of my medical decision-making.   EKG Interpretation None  MDM   Final diagnoses:  Bacterial vaginosis    Afebrile, nontoxic patient with abnormal vaginal discharge.  i-stat HCG is negative.  Wet prep with clue cells.  STD/HIV tests pending.   D/C home with clindamycin (allergic to flagyl), recommended OTC RePhresh.  GYN referral given.  Discussed result, findings, treatment, and follow up  with patient.  Pt given return precautions.  Pt  verbalizes understanding and agrees with plan.      I doubt any other [facility] precluding discharge at this time including, but not necessarily limited to the following: pelvic inflammatory disease, tubo-ovarian abscess     Trixie Dredgemily Kingstin Heims, PA-C 01/02/15 [id number]  Loren Raceravid Yelverton, MD 01/02/15 1233

## 2015-01-02 NOTE — Discharge Instructions (Signed)
Read the information below.  Use the prescribed medication as directed.  Please discuss all new medications with your pharmacist.  You may return to the Emergency Department at any time for worsening condition or any new symptoms that concern you.   If you develop high fevers, abdominal pain, uncontrolled vomiting, or are unable to tolerate fluids by mouth, return to the ER for a recheck.     Bacterial Vaginosis Bacterial vaginosis is a vaginal infection that occurs when the normal balance of bacteria in the vagina is disrupted. It results from an overgrowth of certain bacteria. This is the most common vaginal infection in women of childbearing age. Treatment is important to prevent complications, especially in pregnant women, as it can cause a premature delivery. CAUSES  Bacterial vaginosis is caused by an increase in harmful bacteria that are normally present in smaller amounts in the vagina. Several different kinds of bacteria can cause bacterial vaginosis. However, the reason that the condition develops is not fully understood. RISK FACTORS Certain activities or behaviors can put you at an increased risk of developing bacterial vaginosis, including:  Having a new sex partner or multiple sex partners.  Douching.  Using an intrauterine device (IUD) for contraception. Women do not get bacterial vaginosis from toilet seats, bedding, swimming pools, or contact with objects around them. SIGNS AND SYMPTOMS  Some women with bacterial vaginosis have no signs or symptoms. Common symptoms include:  Grey vaginal discharge.  A fishlike odor with discharge, especially after sexual intercourse.  Itching or burning of the vagina and vulva.  Burning or pain with urination. DIAGNOSIS  Your health care provider will take a medical history and examine the vagina for signs of bacterial vaginosis. A sample of vaginal fluid may be taken. Your health care provider will look at this sample under a microscope  to check for bacteria and abnormal cells. A vaginal pH test may also be done.  TREATMENT  Bacterial vaginosis may be treated with antibiotic medicines. These may be given in the form of a pill or a vaginal cream. A second round of antibiotics may be prescribed if the condition comes back after treatment. Because bacterial vaginosis increases your risk for sexually transmitted diseases, getting treated can help reduce your risk for chlamydia, gonorrhea, HIV, and herpes. HOME CARE INSTRUCTIONS   Only take over-the-counter or prescription medicines as directed by your health care provider.  If antibiotic medicine was prescribed, take it as directed. Make sure you finish it even if you start to feel better.  Tell all sexual partners that you have a vaginal infection. They should see their health care provider and be treated if they have problems, such as a mild rash or itching.  During treatment, it is important that you follow these instructions:  Avoid sexual activity or use condoms correctly.  Do not douche.  Avoid alcohol as directed by your health care provider.  Avoid breastfeeding as directed by your health care provider. SEEK MEDICAL CARE IF:   Your symptoms are not improving after 3 days of treatment.  You have increased discharge or pain.  You have a fever. MAKE SURE YOU:   Understand these instructions.  Will watch your condition.  Will get help right away if you are not doing well or get worse. FOR MORE INFORMATION  Centers for Disease Control and Prevention, Division of STD Prevention: [employer]s.[employer].za American Sexual Health Association (ASHA): www.ashastd.org    This information is not intended to replace advice given to you by your  health care provider. Make sure you discuss any questions you have with your health care provider.   Document Released: 01/10/2005 Document Revised: 01/31/2014 Document Reviewed: 08/22/2012 Elsevier Interactive Patient Education [employer]2016  [employer]vier [employer]

## 2015-01-05 LAB — GC/CHLAMYDIA PROBE AMP (~~LOC~~) NOT AT ARMC
CHLAMYDIA, DNA PROBE: NEGATIVE
Neisseria Gonorrhea: NEGATIVE

## 2015-04-01 ENCOUNTER — Emergency Department (HOSPITAL_COMMUNITY)
Admission: EM | Admit: 2015-04-01 | Discharge: 2015-04-01 | Disposition: A | Discharge status: Home / Self Care | Payer: Medicaid Other | Attending: Emergency Medicine | Admitting: Emergency Medicine

## 2015-04-01 ENCOUNTER — Encounter (HOSPITAL_COMMUNITY): Payer: Self-pay | Admitting: *Deleted

## 2015-04-01 DIAGNOSIS — F17210 Nicotine dependence, cigarettes, uncomplicated: Secondary | ICD-10-CM | POA: Insufficient documentation

## 2015-04-01 DIAGNOSIS — N898 Other specified noninflammatory disorders of vagina: Secondary | ICD-10-CM | POA: Insufficient documentation

## 2015-04-01 LAB — WET PREP, GENITAL
Sperm: NONE SEEN
Trich, Wet Prep: NONE SEEN
Yeast Wet Prep HPF POC: NONE SEEN

## 2015-04-01 MED ORDER — CEFTRIAXONE SODIUM 250 MG IJ SOLR
250.0000 mg | Freq: Once | INTRAMUSCULAR | Status: AC
  Administered 2015-04-01: 250 mg via INTRAMUSCULAR
  Filled 2015-04-01: qty 250

## 2015-04-01 MED ORDER — DOXYCYCLINE HYCLATE 100 MG PO CAPS: 100.0000 mg | ORAL_CAPSULE | Freq: Two times a day (BID) | ORAL | Status: DC

## 2015-04-01 MED ORDER — LIDOCAINE HCL (PF) 1 % IJ SOLN
INTRAMUSCULAR | Status: AC
  Administered 2015-04-01: 1 mL
  Filled 2015-04-01: qty 5

## 2015-04-01 NOTE — ED Notes (Signed)
Pt verbalized understanding of d/c instructions, prescriptions, and follow-up care. No further questions/concerns, VSS, ambulatory w/ steady gait (refused wheelchair) 

## 2015-04-01 NOTE — Discharge Instructions (Signed)
Return here as needed.  Follow-up with the clinic provided.  °

## 2015-04-01 NOTE — ED Provider Notes (Signed)
CSN: [id number]     Arrival date & time 04/01/15  0930 History   First MD Initiated Contact with Patient 04/01/15 1157     Chief Complaint  Patient presents with  . Vaginal Discharge     (Consider location/radiation/quality/duration/timing/severity/associated sxs/prior Treatment) HPI Patient presents to the emergency department with vaginal discharge that started last week.  Patient states she has been seen here multiple times for this and had treatment for BV.  Patient states that this seems to be recurrent issue for her.  The patient denies having any abdominal pain, chest pain, shortness of breath, nausea, vomiting, weakness, headache, blurred vision, back pain, dysuria, bloody stool, hematemesis, fever, incontinence, rash, near syncope or syncope.  The patient states that she did not take any medications prior to arrival.  Nothing seems to make the condition better or worse Past Medical History  Diagnosis Date  . BV (bacterial vaginosis)    History reviewed. No pertinent past surgical history. No family history on file. Social History  Substance Use Topics  . Smoking status: Current Some Day Smoker    Types: Cigars  . Smokeless tobacco: None  . Alcohol Use: Yes     Comment: socially   OB History    No data available     Review of Systems  All other systems negative except as documented in the HPI. All pertinent positives and negatives as reviewed in the HPI.   Allergies  Flagyl and Ciprofloxacin  Home Medications   Prior to Admission medications   Medication Sig Start Date End Date Taking? Authorizing Provider  clindamycin (CLEOCIN) 150 MG capsule Take 2 capsules (300 mg total) by mouth 2 (two) times daily. For the next 7 days 01/02/15   Trixie DredgeEmily West, PA-C  ibuprofen (ADVIL,MOTRIN) 200 MG tablet Take 200 mg by mouth every 6 (six) hours as needed for moderate pain.    Historical Provider, MD   BP 96/65 mmHg  Pulse 81  Temp(Src) 98.6 F (37 C) (Oral)  Resp 16  Ht 5\' 2"   (1.575 m)  Wt 44.453 kg  BMI 17.92 kg/m2  SpO2 100%  LMP 03/06/2015 Physical Exam  Constitutional: She is oriented to person, place, and time. She appears well-developed and well-nourished. No distress.  HENT:  Head: Normocephalic and atraumatic.  Mouth/Throat: Oropharynx is clear and moist.  Eyes: Pupils are equal, round, and reactive to light.  Neck: Normal range of motion. Neck supple.  Cardiovascular: Normal rate, regular rhythm and normal heart sounds.  Exam reveals no gallop and no friction rub.   No murmur heard. Pulmonary/Chest: Effort normal and breath sounds normal. No respiratory distress. She has no wheezes.  Abdominal: Soft. Bowel sounds are normal. She exhibits no distension. There is no tenderness. There is no rebound.  Genitourinary: Uterus normal. Uterus is not deviated and not enlarged. Cervix exhibits discharge. Cervix exhibits no motion tenderness and no friability. Right adnexum displays no mass and no tenderness. Left adnexum displays no mass and no tenderness.  Neurological: She is alert and oriented to person, place, and time. She exhibits normal muscle tone. Coordination normal.  Skin: Skin is warm and dry. No rash noted. No erythema.  Psychiatric: She has a normal mood and affect. Her behavior is normal.  Nursing note and vitals reviewed.   ED Course  Procedures (including critical care time) Labs Review Labs Reviewed  WET PREP, GENITAL - Abnormal; Notable for the following:    Clue Cells Wet Prep HPF POC PRESENT (*)    WBC,  Wet Prep HPF POC PRESENT (*)    All other components within normal limits  GC/CHLAMYDIA PROBE AMP ([place]) NOT AT [facility]    Imaging Review No results found. I have personally reviewed and evaluated these images and lab results as part of my medical decision-making.  Patient will be treated for a low level PID, since he has had multiple visits for discharge.  I feel the patient will need follow-up with GYN.  Told to return here as  needed.  Patient agrees the plan and all questions were answered   Charlestine Night, PA-C 04/01/15 1352  Alvira Monday, MD 04/01/15 2253

## 2015-04-01 NOTE — ED Notes (Signed)
Pt reports vaginal discharge that started last week. Pt now reports blood when she wipes. Denies any urinary symptoms.

## 2016-07-01 ENCOUNTER — Emergency Department (HOSPITAL_COMMUNITY)
Admission: EM | Admit: 2016-07-01 | Discharge: 2016-07-01 | Disposition: A | Discharge status: Home / Self Care | Payer: Medicaid Other | Attending: Emergency Medicine | Admitting: Emergency Medicine

## 2016-07-01 ENCOUNTER — Encounter (HOSPITAL_COMMUNITY): Payer: Self-pay

## 2016-07-01 ENCOUNTER — Emergency Department (HOSPITAL_COMMUNITY): Payer: Medicaid Other

## 2016-07-01 DIAGNOSIS — O0281 Inappropriate change in quantitative human chorionic gonadotropin (hCG) in early pregnancy: Secondary | ICD-10-CM | POA: Insufficient documentation

## 2016-07-01 DIAGNOSIS — Z87891 Personal history of nicotine dependence: Secondary | ICD-10-CM | POA: Diagnosis not present

## 2016-07-01 DIAGNOSIS — Z3A01 Less than 8 weeks gestation of pregnancy: Secondary | ICD-10-CM | POA: Diagnosis not present

## 2016-07-01 DIAGNOSIS — O418X10 Other specified disorders of amniotic fluid and membranes, first trimester, not applicable or unspecified: Secondary | ICD-10-CM

## 2016-07-01 DIAGNOSIS — O26891 Other specified pregnancy related conditions, first trimester: Secondary | ICD-10-CM | POA: Diagnosis present

## 2016-07-01 DIAGNOSIS — R1030 Lower abdominal pain, unspecified: Secondary | ICD-10-CM

## 2016-07-01 DIAGNOSIS — O468X1 Other antepartum hemorrhage, first trimester: Secondary | ICD-10-CM | POA: Insufficient documentation

## 2016-07-01 DIAGNOSIS — Z79899 Other long term (current) drug therapy: Secondary | ICD-10-CM | POA: Insufficient documentation

## 2016-07-01 DIAGNOSIS — O200 Threatened abortion: Secondary | ICD-10-CM

## 2016-07-01 LAB — URINALYSIS, ROUTINE W REFLEX MICROSCOPIC
BILIRUBIN URINE: NEGATIVE
Glucose, UA: NEGATIVE mg/dL
Hgb urine dipstick: NEGATIVE
Ketones, ur: NEGATIVE mg/dL
Leukocytes, UA: NEGATIVE
NITRITE: NEGATIVE
Protein, ur: NEGATIVE mg/dL
SPECIFIC GRAVITY, URINE: 1.013 (ref 1.005–1.030)
pH: 6 (ref 5.0–8.0)

## 2016-07-01 LAB — COMPREHENSIVE METABOLIC PANEL
ALK PHOS: 37 U/L — AB (ref 38–126)
ALT: 11 U/L — ABNORMAL LOW (ref 14–54)
ANION GAP: 8 (ref 5–15)
AST: 18 U/L (ref 15–41)
Albumin: 4 g/dL (ref 3.5–5.0)
BILIRUBIN TOTAL: 0.3 mg/dL (ref 0.3–1.2)
BUN: 10 mg/dL (ref 6–20)
CALCIUM: 9.2 mg/dL (ref 8.9–10.3)
CO2: 24 mmol/L (ref 22–32)
Chloride: 103 mmol/L (ref 101–111)
Creatinine, Ser: 0.57 mg/dL (ref 0.44–1.00)
GFR calc Af Amer: >60 mL/min (ref >60)
GFR calc non Af Amer: >60 mL/min (ref >60)
Glucose, Bld: 88 mg/dL (ref 65–99)
POTASSIUM: 3.9 mmol/L (ref 3.5–5.1)
Sodium: 135 mmol/L (ref 135–145)
TOTAL PROTEIN: 7.4 g/dL (ref 6.5–8.1)

## 2016-07-01 LAB — CBC
HEMATOCRIT: 33 % — AB (ref 36.0–46.0)
HEMOGLOBIN: 11.7 g/dL — AB (ref 12.0–15.0)
MCH: 27.3 pg (ref 26.0–34.0)
MCHC: 35.5 g/dL (ref 30.0–36.0)
MCV: 77.1 fL — ABNORMAL LOW (ref 78.0–100.0)
Platelets: 229 10*3/uL (ref 150–400)
RBC: 4.28 MIL/uL (ref 3.87–5.11)
RDW: 14.8 % (ref 11.5–15.5)
WBC: 4.7 10*3/uL (ref 4.0–10.5)

## 2016-07-01 LAB — POC URINE PREG, ED: PREG TEST UR: POSITIVE — AB

## 2016-07-01 LAB — LIPASE, BLOOD: Lipase: 30 U/L (ref 11–51)

## 2016-07-01 LAB — ABO/RH: ABO/RH(D): B POS

## 2016-07-01 LAB — HCG, QUANTITATIVE, PREGNANCY: HCG, BETA CHAIN, QUANT, S: 76873 m[IU]/mL — AB (ref <5)

## 2016-07-01 NOTE — ED Provider Notes (Signed)
[facility]-EMERGENCY DEPT Provider Note   CSN: [id number] Arrival date & time: 07/01/16  [id number]     History   Chief Complaint Chief Complaint  Patient presents with  . Rectal Pain  . Abdominal Pain    HPI Charlotta Newtonaris L Puder is a 31 y.o. female.  30yo F G2P1001 at 6 weeks by LMP who p/w lower abdominal pain. The patient had a positive pregnancy test on 5/30 that was confirmed at a clinic. She has not yet had an OB/GYN evaluation. She has been doing well up until this morning approximately 30 minutes prior to arrival, when she began having sharp, severe pains across her lower abdomen and some pain shooting up into her rectal. She denies any associated vaginal bleeding or discharge. No pain in her back. No nausea, vomiting, diarrhea, constipation, or blood in her stool. No fevers or recent illness. She does report she is hungry.   The history is provided by the patient.  Abdominal Pain      Past Medical History:  Diagnosis Date  . BV (bacterial vaginosis)     There are no active problems to display for this patient.   History reviewed. No pertinent surgical history.  OB History    Gravida Para Term Preterm AB Living   1             SAB TAB Ectopic Multiple Live Births                   Home Medications    Prior to Admission medications   Medication Sig Start Date End Date Taking? Authorizing Provider  prenatal vitamin w/FE, FA (PRENATAL 1 + 1) 27-1 MG TABS tablet Take 1 tablet by mouth daily at 12 noon.   Yes [provider]    Family History No family history on file.  Social History Social History  Substance Use Topics  . Smoking status: Former Smoker    Types: Cigars  . Smokeless tobacco: Never Used  . Alcohol use Yes     Comment: socially     Allergies   Flagyl [metronidazole] and Ciprofloxacin   Review of Systems Review of Systems  Gastrointestinal: Positive for abdominal pain.   10 Systems reviewed and are negative for acute change except as  noted in the HPI.   Physical Exam Updated Vital Signs BP 102/69 (BP Location: Right Arm)   Pulse 78   Temp 97.6 F (36.4 C) (Oral)   Resp 12   Ht 5\' 2"  (1.575 m)   Wt 48.1 kg (106 lb)   SpO2 100%   BMI 19.39 kg/m   Physical Exam  Constitutional: She is oriented to person, place, and time. She appears well-developed and well-nourished. No distress.  Uncomfortable, mildly anxious  HENT:  Head: Normocephalic and atraumatic.  Moist mucous membranes  Eyes: Conjunctivae are normal. Pupils are equal, round, and reactive to light.  Neck: Neck supple.  Cardiovascular: Normal rate, regular rhythm and normal heart sounds.   No murmur heard. Pulmonary/Chest: Effort normal and breath sounds normal.  Abdominal: Soft. She exhibits no distension. Bowel sounds are increased. There is tenderness (across lower abd including RLQ, LLQ and suprapubic abd). There is no rebound and no guarding.  Musculoskeletal: She exhibits no edema.  Neurological: She is alert and oriented to person, place, and time.  Fluent speech  Skin: Skin is warm and dry.  Psychiatric: She has a normal mood and affect. Judgment normal.  Nursing note and vitals reviewed.    ED  Treatments / Results  Labs (all labs ordered are listed, but only abnormal results are displayed) Labs Reviewed  COMPREHENSIVE METABOLIC PANEL - Abnormal; Notable for the following:       Result Value   ALT 11 (*)    Alkaline Phosphatase 37 (*)    All other components within normal limits  CBC - Abnormal; Notable for the following:    Hemoglobin 11.7 (*)    HCT 33.0 (*)    MCV 77.1 (*)    All other components within normal limits  HCG, QUANTITATIVE, PREGNANCY - Abnormal; Notable for the following:    hCG, Beta Chain, Mahalia Longest 16,109 (*)    All other components within normal limits  POC URINE PREG, ED - Abnormal; Notable for the following:    Preg Test, Ur POSITIVE (*)    All other components within normal limits  LIPASE, BLOOD    URINALYSIS, ROUTINE W REFLEX MICROSCOPIC  ABO/RH    EKG  EKG Interpretation None       Radiology US Ob Comp < 14 Wks  Result Date: 07/01/2016 CLINICAL DATA:  Acute abdominal and pelvic pain EXAM: OBSTETRIC <14 WK [place] AND TRANSVAGINAL OB US TECHNIQUE: Both transabdominal and transvaginal ultrasound examinations were performed for complete evaluation of the gestation as well as the maternal uterus, adnexal regions, and pelvic cul-de-sac. Transvaginal technique was performed to assess early pregnancy. COMPARISON:  None. FINDINGS: Intrauterine gestational sac: Visualized Yolk sac:  Visualized Embryo:  Visualized Cardiac Activity: Visualized Heart Rate: 124  bpm CRL:  5  mm   6 w   1 d                  [place] EDC: February 23, 2017 Subchorionic hemorrhage: There is a subchorionic hemorrhage measuring 1.3 x 0.6 cm. Maternal uterus/adnexae: Cervical os is closed. There is no intrauterine mass. There is no extrauterine pelvic or adnexal mass. Trace free pelvic fluid noted. IMPRESSION: Single live intrauterine gestation with estimated gestational age of approximately 6 weeks. Small subchorionic hemorrhage. Trace free pelvic fluid may be physiologic. Study otherwise unremarkable. Electronically Signed   By: Bretta Bang III M.D.   On: 07/01/2016 10:53   US Ob Transvaginal  Result Date: 07/01/2016 CLINICAL DATA:  Acute abdominal and pelvic pain EXAM: OBSTETRIC <14 WK [place] AND TRANSVAGINAL OB US TECHNIQUE: Both transabdominal and transvaginal ultrasound examinations were performed for complete evaluation of the gestation as well as the maternal uterus, adnexal regions, and pelvic cul-de-sac. Transvaginal technique was performed to assess early pregnancy. COMPARISON:  None. FINDINGS: Intrauterine gestational sac: Visualized Yolk sac:  Visualized Embryo:  Visualized Cardiac Activity: Visualized Heart Rate: 124  bpm CRL:  5  mm   6 w   1 d                  [place] EDC: February 23, 2017 Subchorionic hemorrhage: There is a  subchorionic hemorrhage measuring 1.3 x 0.6 cm. Maternal uterus/adnexae: Cervical os is closed. There is no intrauterine mass. There is no extrauterine pelvic or adnexal mass. Trace free pelvic fluid noted. IMPRESSION: Single live intrauterine gestation with estimated gestational age of approximately 6 weeks. Small subchorionic hemorrhage. Trace free pelvic fluid may be physiologic. Study otherwise unremarkable. Electronically Signed   By: Bretta Bang III M.D.   On: 07/01/2016 10:53    Procedures Procedures (including critical care time)  Medications Ordered in ED Medications - No data to display   Initial Impression / Assessment and Plan / ED Course  I have reviewed the triage vital signs and the nursing notes.  Pertinent labs & imaging results that were available during my care of the patient were reviewed by me and considered in my medical decision making (see chart for details).     Pt w/ recent positive pregnancy test p/w sudden onset lower abdominal pain w/ shooting pain in rectum. On exam, she had normal VS and had tenderness across lower abd with no distention. Obtained above labs as well as [place] to evaluate for ectopic pregnancy vs miscarriage.  Labwork shows hemoglobin 11.7, normal UA, Rh+, HCG 76000. US shows single live IUP at 6 weeks with FHR 124. Small subchorionic hemorrhage. I discussed findings with patient and discussed that this represents a threatened miscarriage although her ultrasound is reassuring. Recommended follow-up with [facility] clinic and discussed supportive measures including prenatal vitamins, Tylenol only for pain, good hydration, rest, and avoidance of alcohol/drugs/cigarettes. Reviewed return precautions including vaginal bleeding or severe worsening of abdominal pain. Patient voiced understanding and was discharged in satisfactory condition.   Final Clinical Impressions(s) / ED Diagnoses   Final diagnoses:  Lower abdominal pain  Subchorionic  hemorrhage of placenta in first trimester, single or unspecified fetus  Threatened miscarriage in early pregnancy    New Prescriptions New Prescriptions   No medications on file     Elzada Pytel, Ambrose [place], MD 07/01/16 1229

## 2016-07-01 NOTE — ED Triage Notes (Signed)
Patient c/o rectal pain and bilateral lowe abdominal pain x 30 minutes ago. Patient is [redacted] weeks pregnant.

## 2016-07-01 NOTE — Discharge Instructions (Signed)
[facility] YOU HAVE ANY VAGINAL BLEEDING OR SEVERE ABDOMINAL PAIN.

## 2016-07-01 NOTE — ED Notes (Signed)
Ultrasound at bedside

## 2016-07-26 LAB — OB RESULTS CONSOLE GC/CHLAMYDIA
CHLAMYDIA, DNA PROBE: NEGATIVE
GC PROBE AMP, GENITAL: NEGATIVE

## 2016-07-26 LAB — OB RESULTS CONSOLE ABO/RH: RH Type: POSITIVE

## 2016-07-26 LAB — OB RESULTS CONSOLE HEPATITIS B SURFACE ANTIGEN: Hepatitis B Surface Ag: NEGATIVE

## 2016-07-26 LAB — OB RESULTS CONSOLE RUBELLA ANTIBODY, IGM: RUBELLA: IMMUNE

## 2016-07-26 LAB — OB RESULTS CONSOLE RPR: RPR: NONREACTIVE

## 2016-07-26 LAB — OB RESULTS CONSOLE ANTIBODY SCREEN: Antibody Screen: NEGATIVE

## 2016-07-26 LAB — OB RESULTS CONSOLE HIV ANTIBODY (ROUTINE TESTING): HIV: NONREACTIVE

## 2016-12-02 ENCOUNTER — Inpatient Hospital Stay (HOSPITAL_COMMUNITY)
Admission: AD | Admit: 2016-12-02 | Discharge: 2016-12-02 | Disposition: A | Discharge status: Home / Self Care | Payer: Medicaid Other | Source: Ambulatory Visit | Attending: Obstetrics and Gynecology | Admitting: Obstetrics and Gynecology

## 2016-12-02 ENCOUNTER — Encounter (HOSPITAL_COMMUNITY): Payer: Self-pay | Admitting: *Deleted

## 2016-12-02 DIAGNOSIS — R109 Unspecified abdominal pain: Secondary | ICD-10-CM | POA: Diagnosis present

## 2016-12-02 DIAGNOSIS — Z87891 Personal history of nicotine dependence: Secondary | ICD-10-CM | POA: Diagnosis not present

## 2016-12-02 DIAGNOSIS — Z3A28 28 weeks gestation of pregnancy: Secondary | ICD-10-CM | POA: Insufficient documentation

## 2016-12-02 DIAGNOSIS — O4703 False labor before 37 completed weeks of gestation, third trimester: Secondary | ICD-10-CM | POA: Diagnosis not present

## 2016-12-02 DIAGNOSIS — O26893 Other specified pregnancy related conditions, third trimester: Secondary | ICD-10-CM | POA: Insufficient documentation

## 2016-12-02 LAB — URINALYSIS, ROUTINE W REFLEX MICROSCOPIC
BILIRUBIN URINE: NEGATIVE
Glucose, UA: NEGATIVE mg/dL
HGB URINE DIPSTICK: NEGATIVE
Ketones, ur: NEGATIVE mg/dL
NITRITE: NEGATIVE
PH: 5 (ref 5.0–8.0)
Protein, ur: NEGATIVE mg/dL
SPECIFIC GRAVITY, URINE: 1.016 (ref 1.005–1.030)

## 2016-12-02 LAB — FETAL FIBRONECTIN: Fetal Fibronectin: NEGATIVE

## 2016-12-02 MED ORDER — NIFEDIPINE 10 MG PO CAPS
10.0000 mg | ORAL_CAPSULE | ORAL | Status: DC | PRN
  Administered 2016-12-02 (×2): 10 mg via ORAL
  Filled 2016-12-02 (×2): qty 1

## 2016-12-02 NOTE — MAU Provider Note (Signed)
History     CSN: [id number]  Arrival date and time: 12/02/16 1712   First Provider Initiated Contact with Patient 12/02/16 1852      Chief Complaint  Patient presents with  . Abdominal Pain   HPI Brianna Ali is a 31 y.o. G2P1001 at 119w1d who presents with abdominal pain. Symptoms began 2 days ago. Reports intermittent lower abdominal cramping & tightening that occurs every 30 minutes. Rates pain 6/10. Has not treated pain. Denies n/v/d, constipation, dysuria, vaginal bleeding, or LOF. Positive fetal movement. Denies recent intercourse. No history of PTL or delivery.  States she has had 1 bottle of water today & has not eaten since 2 pm.   OB History    Gravida Para Term Preterm AB Living   2 1 1     1    SAB TAB Ectopic Multiple Live Births                  Past Medical History:  Diagnosis Date  . BV (bacterial vaginosis)     Past Surgical History:  Procedure Laterality Date  . NO PAST SURGERIES      Family History  Problem Relation Age of Onset  . Hypertension Mother   . Stroke Mother   . Diabetes Maternal Grandmother     Social History   Tobacco Use  . Smoking status: Former Smoker    Types: Cigars  . Smokeless tobacco: Never Used  Substance Use Topics  . Alcohol use: Yes    Comment: socially  . Drug use: No    Allergies:  Allergies  Allergen Reactions  . Flagyl [Metronidazole] Hives  . Ciprofloxacin Rash    Medications Prior to Admission  Medication Sig Dispense Refill Last Dose  . prenatal vitamin w/FE, FA (PRENATAL 1 + 1) 27-1 MG TABS tablet Take 1 tablet by mouth daily at 12 noon.   06/30/2016 at Unknown time    Review of Systems  Constitutional: Negative.   Gastrointestinal: Positive for abdominal pain. Negative for constipation, diarrhea, nausea and vomiting.  Genitourinary: Negative.    Physical Exam   Blood pressure 95/70, pulse 89, temperature 98.3 F (36.8 C), temperature source Oral, resp. rate 18, height 5\' 2"  (1.575 m), weight  115 lb (52.2 kg), SpO2 100 %.  Physical Exam  Nursing note and vitals reviewed. Constitutional: She is oriented to person, place, and time. She appears well-developed and well-nourished. No distress.  HENT:  Head: Normocephalic and atraumatic.  Eyes: Conjunctivae are normal. Right eye exhibits no discharge. Left eye exhibits no discharge. No scleral icterus.  Neck: Normal range of motion.  Respiratory: Effort normal. No respiratory distress.  GI: Soft. There is no tenderness.  Ctx palpate mild with adequate rest  Genitourinary:  Genitourinary Comments: Dilation: Closed Effacement (%): Thick Cervical Position: Posterior Exam by:: e lawrence np   Neurological: She is alert and oriented to person, place, and time.  Skin: Skin is warm and dry. She is not diaphoretic.  Psychiatric: She has a normal mood and affect. Her behavior is normal. Judgment and thought content normal.   Fetal Tracing:  Baseline: 135 Variability: moderate Accelerations: 10x10 Decelerations: few variables  Toco: UI & Q2-4 mins, resolved with Procardia x 2 MAU Course  Procedures Results for orders placed or performed during the hospital encounter of 12/02/16 (from the past 24 hour(s))  Urinalysis, Routine w reflex microscopic     Status: Abnormal   Collection Time: 12/02/16  5:40 PM  Result Value Ref Range  Color, Urine YELLOW YELLOW   APPearance CLEAR CLEAR   Specific Gravity, Urine 1.016 1.005 - 1.030   pH 5.0 5.0 - 8.0   Glucose, UA NEGATIVE NEGATIVE mg/dL   Hgb urine dipstick NEGATIVE NEGATIVE   Bilirubin Urine NEGATIVE NEGATIVE   Ketones, ur NEGATIVE NEGATIVE mg/dL   Protein, ur NEGATIVE NEGATIVE mg/dL   Nitrite NEGATIVE NEGATIVE   Leukocytes, UA TRACE (A) NEGATIVE   RBC / HPF 0-5 0 - 5 RBC/hpf   WBC, UA 0-5 0 - 5 WBC/hpf   Bacteria, UA RARE (A) NONE SEEN   Squamous Epithelial / LPF 0-5 (A) NONE SEEN   Mucus PRESENT   Fetal fibronectin     Status: None   Collection Time: 12/02/16  7:47 PM   Result Value Ref Range   Fetal Fibronectin NEGATIVE NEGATIVE    MDM Category 1 tracing. After monitors adjust patient appears to be contracting every 2-3 minutes. Cervix is closed/thick/posterior.  C/w Dr. Normand Sloopillard. Will continue PO hydration, give procardia per protocol, & order FFN.  Procardia 10 mg PO every 20 minutes x 2 doses PRN contractions ordered  Care [facility] Lecuyer [facility]    Judeth HornLawrence, Erin, NP 12/02/2016 8:08 PM   Discussed with Dr. Normand Sloopillard. Ok for discharge at this time with preterm precautions. She has reviewed the EFM tracing. Assessment and Plan  A: SIUP at 557w1d Preterm contractions, third trimester  P:  Discharge home Preterm labor precautions and pelvic rest discussed Patient advised to follow-up with [facility] as scheduled for routine prenatal care or sooner PRN Patient may return to MAU as needed or if her condition were to change or worsen  Kathlene CoteWenzel, Reiko Vinje N, PA-C 12/02/2016 9:48 PM

## 2016-12-02 NOTE — MAU Note (Signed)
Pt reports for the last few days she has been having a lot of tightening in her lower abd, denies bleeding or ROM

## 2016-12-02 NOTE — Discharge Instructions (Signed)
Braxton Hicks Contractions °Contractions of the uterus can occur throughout pregnancy, but they are not always a sign that you are in labor. You may have practice contractions called Braxton Hicks contractions. These false labor contractions are sometimes confused with true labor. °What are Braxton Hicks contractions? °Braxton Hicks contractions are tightening movements that occur in the muscles of the uterus before labor. Unlike true labor contractions, these contractions do not result in opening (dilation) and thinning of the cervix. Toward the end of pregnancy (32-34 weeks), Braxton Hicks contractions can happen more often and may become stronger. These contractions are sometimes difficult to tell apart from true labor because they can be very uncomfortable. You should not feel embarrassed if you go to the hospital with false labor. °Sometimes, the only way to tell if you are in true labor is for your health care provider to look for changes in the cervix. The health care provider will do a physical exam and may monitor your contractions. If you are not in true labor, the exam should show that your cervix is not dilating and your water has not broken. °If there are no prenatal problems or other health problems associated with your pregnancy, it is completely safe for you to be sent home with false labor. You may continue to have Braxton Hicks contractions until you go into true labor. °How can I tell the difference between true labor and false labor? °· Differences °? False labor °? Contractions last 30-70 seconds.: Contractions are usually shorter and not as strong as true labor contractions. °? Contractions become very regular.: Contractions are usually irregular. °? Discomfort is usually felt in the top of the uterus, and it spreads to the lower abdomen and low back.: Contractions are often felt in the front of the lower abdomen and in the groin. °? Contractions do not go away with walking.: Contractions may  go away when you walk around or change positions while lying down. °? Contractions usually become more intense and increase in frequency.: Contractions get weaker and are shorter-lasting as time goes on. °? The cervix dilates and gets thinner.: The cervix usually does not dilate or become thin. °Follow these instructions at home: °· Take over-the-counter and prescription medicines only as told by your health care provider. °· Keep up with your usual exercises and follow other instructions from your health care provider. °· Eat and drink lightly if you think you are going into labor. °· If Braxton Hicks contractions are making you uncomfortable: °? Change your position from lying down or resting to walking, or change from walking to resting. °? Sit and rest in a tub of warm water. °? Drink enough fluid to keep your urine clear or pale yellow. Dehydration may cause these contractions. °? Do slow and deep breathing several times an hour. °· Keep all follow-up prenatal visits as told by your health care provider. This is important. °Contact a health care provider if: °· You have a fever. °· You have continuous pain in your abdomen. °Get help right away if: °· Your contractions become stronger, more regular, and closer together. °· You have fluid leaking or gushing from your vagina. °· You pass blood-tinged mucus (bloody show). °· You have bleeding from your vagina. °· You have low back pain that you never had before. °· You feel your baby’s head pushing down and causing pelvic pressure. °· Your baby is not moving inside you as much as it used to. °Summary °· Contractions that occur before labor are   called Braxton Hicks contractions, false labor, or practice contractions.  Braxton Hicks contractions are usually shorter, weaker, farther apart, and less regular than true labor contractions. True labor contractions usually become progressively stronger and regular and they become more frequent.  Manage discomfort from  [facility] contractions by changing position, resting in a warm bath, drinking plenty of water, or practicing deep breathing. This information is not intended to replace advice given to you by your health care provider. Make sure you discuss any questions you have with your health care provider. Document Released: 01/10/2005 Document Revised: 11/30/2015 Document Reviewed: 11/30/2015 Elsevier Interactive Patient Education  2017 Elsevier Inc.   Pelvic Rest Pelvic rest may be recommended if:  Your placenta is partially or completely covering the opening of your cervix (placenta previa).  There is bleeding between the wall of the uterus and the amniotic sac in the first trimester of pregnancy (subchorionic hemorrhage).  You went into labor too early (preterm labor).  Based on your overall health and the health of your baby, your health care provider will decide if pelvic rest is right for you. How do I rest my pelvis? For as long as told by your health care provider:  Do not have sex, sexual stimulation, or an orgasm.  Do not use tampons. Do not douche. Do not put anything in your vagina.  Do not lift anything that is heavier than 10 lb (4.5 kg).  Avoid activities that take a lot of effort (are strenuous).  Avoid any activity in which your pelvic muscles could become strained.  When should I seek medical care? Seek medical care if you have:  Cramping pain in your lower abdomen.  Vaginal discharge.  A low, dull backache.  Regular contractions.  Uterine tightening.  When should I seek immediate medical care? Seek immediate medical care if:  You have vaginal bleeding and you are pregnant.  This information is not intended to replace advice given to you by your health care provider. Make sure you discuss any questions you have with your health care provider. Document Released: 05/07/2010 Document Revised: 06/18/2015 Document Reviewed: 07/14/2014 Elsevier Interactive  Patient Education  2018 [employer]   Preterm Labor and Birth Information Pregnancy normally lasts 39-41 weeks. Preterm labor is when labor starts early. It starts before you have been pregnant for 37 whole weeks. What are the risk factors for preterm labor? Preterm labor is more likely to occur in women who:  Have an infection while pregnant.  Have a cervix that is short.  Have gone into preterm labor before.  Have had surgery on their cervix.  Are younger than age 30.  Are older than age 30.  Are African American.  Are pregnant with two or more babies.  Take street drugs while pregnant.  Smoke while pregnant.  Do not gain enough weight while pregnant.  Got pregnant right after another pregnancy.  What are the symptoms of preterm labor? Symptoms of preterm labor include:  Cramps. The cramps may feel like the cramps some women get during their period. The cramps may happen with watery poop (diarrhea).  Pain in the belly (abdomen).  Pain in the lower back.  Regular contractions or tightening. It may feel like your belly is getting tighter.  Pressure in the lower belly that seems to get stronger.  More fluid (discharge) leaking from the vagina. The fluid may be watery or bloody.  Water breaking.  Why is it important to notice signs of preterm labor? Babies who  are born early may not be fully developed. They have a higher chance for:  Long-term heart problems.  Long-term lung problems.  Trouble controlling body systems, like breathing.  Bleeding in the brain.  A condition called cerebral palsy.  Learning difficulties.  Death.  These risks are highest for babies who are born before 34 weeks of pregnancy. How is preterm labor treated? Treatment depends on:  How long you were pregnant.  Your condition.  The health of your baby.  Treatment may involve:  Having a stitch (suture) placed in your cervix. When you give birth, your cervix opens so  the baby can come out. The stitch keeps the cervix from opening too soon.  Staying at the hospital.  Taking or getting medicines, such as: ? Hormone medicines. ? Medicines to stop contractions. ? Medicines to help the babys lungs develop. ? Medicines to prevent your baby from having cerebral palsy.  What should I do if I am in preterm labor? If you think you are going into labor too soon, call your doctor right away. How can I prevent preterm labor?  Do not use any tobacco products. ? Examples of these are cigarettes, chewing tobacco, and e-cigarettes. ? If you need help quitting, ask your doctor.  Do not use street drugs.  Do not use any medicines unless you ask your doctor if they are safe for you.  Talk with your doctor before taking any herbal supplements.  Make sure you gain enough weight.  Watch for infection. If you think you might have an infection, get it checked right away.  If you have gone into preterm labor before, tell your doctor. This information is not intended to replace advice given to you by your health care provider. Make sure you discuss any questions you have with your health care provider. Document Released: 04/08/2008 Document Revised: 06/23/2015 Document Reviewed: 06/03/2015 Elsevier Interactive Patient Education  2018 [employer]

## 2016-12-02 NOTE — Progress Notes (Signed)
2100- Pt reports cntx pain/cramping stopped.  PA Wenzel notified. Per Dr Normand Sloopillard  Pt will be d/c home.

## 2016-12-04 LAB — CULTURE, OB URINE: Culture: NO GROWTH

## 2017-01-20 LAB — OB RESULTS CONSOLE GBS: STREP GROUP B AG: POSITIVE

## 2017-01-24 NOTE — L&D Delivery Note (Signed)
Delivery Note Patient pushed for less than 20 minutes after she was noted to be C/C/+2.  At 1:48 AM a viable and healthy female was delivered via Vaginal, Spontaneous (Presentation: OA ).  Baby placed on maternal abdomen. APGAR: 8, 9; weight 6 lb 8.1 oz (2951 g).  Cord double clamped and cut by father. Cord blood obtained.  Placenta spontaneously delivered intact 3 vessels. Uterine atony alleviated with massage and IV pitocin. No lacerations or complications.   Patient tolerated delivery well  Anesthesia:  Epidural Episiotomy: None Lacerations: None Suture Repair: n/a Est. Blood Loss (mL): 400  Mom to postpartum.  Baby to Couplet care / Skin to Skin.  Brianna Ali, Brianna Ali 02/11/2017, 1:30 PM

## 2017-01-28 ENCOUNTER — Inpatient Hospital Stay (HOSPITAL_COMMUNITY)
Admission: AD | Admit: 2017-01-28 | Discharge: 2017-01-28 | Disposition: A | Discharge status: Home / Self Care | Payer: Medicaid Other | Source: Ambulatory Visit | Attending: Obstetrics & Gynecology | Admitting: Obstetrics & Gynecology

## 2017-01-28 ENCOUNTER — Encounter (HOSPITAL_COMMUNITY): Payer: Self-pay | Admitting: *Deleted

## 2017-01-28 DIAGNOSIS — O4703 False labor before 37 completed weeks of gestation, third trimester: Secondary | ICD-10-CM | POA: Insufficient documentation

## 2017-01-28 DIAGNOSIS — Z3A36 36 weeks gestation of pregnancy: Secondary | ICD-10-CM | POA: Insufficient documentation

## 2017-01-28 DIAGNOSIS — O479 False labor, unspecified: Secondary | ICD-10-CM

## 2017-01-28 HISTORY — DX: Anemia, unspecified: D64.9

## 2017-01-28 LAB — URINALYSIS, ROUTINE W REFLEX MICROSCOPIC
Bilirubin Urine: NEGATIVE
Glucose, UA: 50 mg/dL — AB
Hgb urine dipstick: NEGATIVE
Ketones, ur: NEGATIVE mg/dL
Nitrite: NEGATIVE
Protein, ur: NEGATIVE mg/dL
Specific Gravity, Urine: 1.01 (ref 1.005–1.030)
pH: 6 (ref 5.0–8.0)

## 2017-01-28 NOTE — MAU Note (Signed)
Pt reports onset of pain about an hour ago, felt like a contraction to start with but the pain is constant.

## 2017-02-10 ENCOUNTER — Other Ambulatory Visit: Payer: Self-pay

## 2017-02-10 ENCOUNTER — Inpatient Hospital Stay (HOSPITAL_COMMUNITY): Payer: Medicaid Other | Admitting: Anesthesiology

## 2017-02-10 ENCOUNTER — Encounter (HOSPITAL_COMMUNITY): Payer: Self-pay

## 2017-02-10 ENCOUNTER — Inpatient Hospital Stay (HOSPITAL_COMMUNITY)
Admission: AD | Admit: 2017-02-10 | Discharge: 2017-02-13 | DRG: 807 | Disposition: A | Discharge status: Home / Self Care | Payer: Medicaid Other | Source: Ambulatory Visit | Attending: Obstetrics & Gynecology | Admitting: Obstetrics & Gynecology

## 2017-02-10 DIAGNOSIS — Z87891 Personal history of nicotine dependence: Secondary | ICD-10-CM | POA: Diagnosis not present

## 2017-02-10 DIAGNOSIS — Z3A38 38 weeks gestation of pregnancy: Secondary | ICD-10-CM | POA: Diagnosis not present

## 2017-02-10 DIAGNOSIS — D649 Anemia, unspecified: Secondary | ICD-10-CM | POA: Diagnosis present

## 2017-02-10 DIAGNOSIS — O9902 Anemia complicating childbirth: Secondary | ICD-10-CM | POA: Diagnosis present

## 2017-02-10 DIAGNOSIS — O99824 Streptococcus B carrier state complicating childbirth: Principal | ICD-10-CM | POA: Diagnosis present

## 2017-02-10 DIAGNOSIS — Z3483 Encounter for supervision of other normal pregnancy, third trimester: Secondary | ICD-10-CM | POA: Diagnosis present

## 2017-02-10 LAB — CBC
HCT: 30.2 % — ABNORMAL LOW (ref 36.0–46.0)
HEMOGLOBIN: 10.2 g/dL — AB (ref 12.0–15.0)
MCH: 25.9 pg — AB (ref 26.0–34.0)
MCHC: 33.8 g/dL (ref 30.0–36.0)
MCV: 76.6 fL — AB (ref 78.0–100.0)
Platelets: 209 10*3/uL (ref 150–400)
RBC: 3.94 MIL/uL (ref 3.87–5.11)
RDW: 17.7 % — ABNORMAL HIGH (ref 11.5–15.5)
WBC: 7 10*3/uL (ref 4.0–10.5)

## 2017-02-10 LAB — ABO/RH: ABO/RH(D): B POS

## 2017-02-10 LAB — TYPE AND SCREEN
ABO/RH(D): B POS
Antibody Screen: NEGATIVE

## 2017-02-10 LAB — POCT FERN TEST: POCT Fern Test: POSITIVE — AB

## 2017-02-10 MED ORDER — LIDOCAINE HCL (PF) 1 % IJ SOLN
30.0000 mL | INTRAMUSCULAR | Status: DC | PRN
  Filled 2017-02-10: qty 30

## 2017-02-10 MED ORDER — TERBUTALINE SULFATE 1 MG/ML IJ SOLN
0.2500 mg | Freq: Once | INTRAMUSCULAR | Status: DC | PRN
  Filled 2017-02-10: qty 1

## 2017-02-10 MED ORDER — ACETAMINOPHEN 325 MG PO TABS
650.0000 mg | ORAL_TABLET | ORAL | Status: DC | PRN
Start: 2017-02-10 — End: 2017-02-13

## 2017-02-10 MED ORDER — OXYTOCIN 40 UNITS IN LACTATED RINGERS INFUSION - SIMPLE MED: 2.5000 [IU]/h | INTRAVENOUS | Status: DC

## 2017-02-10 MED ORDER — LACTATED RINGERS IV SOLN: 500.0000 mL | INTRAVENOUS | Status: DC | PRN

## 2017-02-10 MED ORDER — EPHEDRINE 5 MG/ML INJ
10.0000 mg | INTRAVENOUS | Status: DC | PRN
  Filled 2017-02-10: qty 2

## 2017-02-10 MED ORDER — LACTATED RINGERS IV SOLN
500.0000 mL | Freq: Once | INTRAVENOUS | Status: AC
  Administered 2017-02-10: 500 mL via INTRAVENOUS

## 2017-02-10 MED ORDER — DIPHENHYDRAMINE HCL 50 MG/ML IJ SOLN: 12.5000 mg | INTRAMUSCULAR | Status: DC | PRN

## 2017-02-10 MED ORDER — FENTANYL 2.5 MCG/ML BUPIVACAINE 1/10 % EPIDURAL INFUSION (WH - ANES)
14.0000 mL/h | INTRAMUSCULAR | Status: DC | PRN
  Administered 2017-02-10: 12.5 mL/h via EPIDURAL
  Filled 2017-02-10 (×2): qty 100

## 2017-02-10 MED ORDER — PHENYLEPHRINE 40 MCG/ML (10ML) SYRINGE FOR IV PUSH (FOR BLOOD PRESSURE SUPPORT)
80.0000 ug | PREFILLED_SYRINGE | INTRAVENOUS | Status: DC | PRN
  Filled 2017-02-10: qty 5

## 2017-02-10 MED ORDER — OXYTOCIN BOLUS FROM INFUSION
500.0000 mL | Freq: Once | INTRAVENOUS | Status: AC
  Administered 2017-02-11: 999 mL/h via INTRAVENOUS

## 2017-02-10 MED ORDER — OXYCODONE-ACETAMINOPHEN 5-325 MG PO TABS: 1.0000 | ORAL_TABLET | ORAL | Status: DC | PRN

## 2017-02-10 MED ORDER — PHENYLEPHRINE 40 MCG/ML (10ML) SYRINGE FOR IV PUSH (FOR BLOOD PRESSURE SUPPORT)
80.0000 ug | PREFILLED_SYRINGE | INTRAVENOUS | Status: DC | PRN
  Filled 2017-02-10: qty 5
  Filled 2017-02-10: qty 10

## 2017-02-10 MED ORDER — LIDOCAINE HCL (PF) 1 % IJ SOLN
INTRAMUSCULAR | Status: DC | PRN
  Administered 2017-02-10: 3 mL via EPIDURAL
  Administered 2017-02-10: 4 mL via EPIDURAL

## 2017-02-10 MED ORDER — SOD CITRATE-CITRIC ACID 500-334 MG/5ML PO SOLN: 30.0000 mL | ORAL | Status: DC | PRN

## 2017-02-10 MED ORDER — FLEET ENEMA 7-19 GM/118ML RE ENEM: 1.0000 | ENEMA | RECTAL | Status: DC | PRN

## 2017-02-10 MED ORDER — OXYCODONE-ACETAMINOPHEN 5-325 MG PO TABS: 2.0000 | ORAL_TABLET | ORAL | Status: DC | PRN

## 2017-02-10 MED ORDER — SODIUM CHLORIDE 0.9 % IV SOLN
2.0000 g | Freq: Four times a day (QID) | INTRAVENOUS | Status: DC
  Administered 2017-02-10: 2 g via INTRAVENOUS
  Filled 2017-02-10 (×5): qty 2000

## 2017-02-10 MED ORDER — ONDANSETRON HCL 4 MG/2ML IJ SOLN: 4.0000 mg | Freq: Four times a day (QID) | INTRAMUSCULAR | Status: DC | PRN

## 2017-02-10 MED ORDER — OXYTOCIN 40 UNITS IN LACTATED RINGERS INFUSION - SIMPLE MED
1.0000 m[IU]/min | INTRAVENOUS | Status: DC
  Filled 2017-02-10: qty 1000

## 2017-02-10 MED ORDER — LACTATED RINGERS IV SOLN
INTRAVENOUS | Status: DC
  Administered 2017-02-10: 23:00:00 via INTRAVENOUS

## 2017-02-10 MED ORDER — LACTATED RINGERS IV SOLN
500.0000 mL | Freq: Once | INTRAVENOUS | Status: AC
  Administered 2017-02-11: 500 mL via INTRAVENOUS

## 2017-02-10 NOTE — MAU Note (Signed)
Contractions since 1900. Some yellow d/c with mucous. Closed last sve

## 2017-02-10 NOTE — Anesthesia Preprocedure Evaluation (Signed)
Anesthesia Evaluation  Patient identified by MRN, date of birth, ID band Patient awake    Reviewed: Allergy & Precautions, Patient's Chart, lab work & pertinent test results  Airway Mallampati: I  TM Distance: >3 FB Neck ROM: Full    Dental  (+) Teeth Intact   Pulmonary former smoker,    Pulmonary exam normal breath sounds clear to auscultation       Cardiovascular negative cardio ROS Normal cardiovascular exam Rhythm:Regular Rate:Normal     Neuro/Psych negative neurological ROS  negative psych ROS   GI/Hepatic negative GI ROS, Neg liver ROS,   Endo/Other  negative endocrine ROS  Renal/GU negative Renal ROS  negative genitourinary   Musculoskeletal negative musculoskeletal ROS (+)   Abdominal   Peds  Hematology  (+) anemia ,   Anesthesia Other Findings   Reproductive/Obstetrics (+) Pregnancy                             Anesthesia Physical Anesthesia Plan  ASA: II  Anesthesia Plan: Epidural   Post-op Pain Management:    Induction:   PONV Risk Score and Plan:   Airway Management Planned: Natural Airway  Additional Equipment:   Intra-op Plan:   Post-operative Plan:   Informed Consent: I have reviewed the patients History and Physical, chart, labs and discussed the procedure including the risks, benefits and alternatives for the proposed anesthesia with the patient or authorized representative who has indicated his/her understanding and acceptance.     Plan Discussed with: Anesthesiologist  Anesthesia Plan Comments:         Anesthesia Quick Evaluation

## 2017-02-10 NOTE — Anesthesia Procedure Notes (Signed)
Epidural Patient location during procedure: OB Start time: 02/10/2017 10:32 PM  Staffing Anesthesiologist: Mal AmabileFoster, Navreet Bolda, MD Performed: anesthesiologist   Preanesthetic Checklist Completed: patient identified, site marked, surgical consent, pre-op evaluation, timeout performed, IV checked, risks and benefits discussed and monitors and equipment checked  Epidural Patient position: sitting Prep: site prepped and draped and DuraPrep Patient monitoring: continuous pulse ox and blood pressure Approach: midline Location: L3-L4 Injection technique: LOR air  Needle:  Needle type: Tuohy  Needle gauge: 17 G Needle length: 9 cm and 9 Needle insertion depth: 4 cm Catheter type: closed end flexible Catheter size: 19 Gauge Catheter at skin depth: 9 cm Test dose: negative and Other  Assessment Events: blood not aspirated, injection not painful, no injection resistance, negative IV test and no paresthesia  Additional Notes Patient identified. Risks and benefits discussed including failed block, incomplete  Pain control, post dural puncture headache, nerve damage, paralysis, blood pressure Changes, nausea, vomiting, reactions to medications-both toxic and allergic and post Partum back pain. All questions were answered. Patient expressed understanding and wished to proceed. Sterile technique was used throughout procedure. Epidural site was Dressed with sterile barrier dressing. No paresthesias, signs of intravascular injection Or signs of intrathecal spread were encountered.  Patient was more comfortable after the epidural was dosed. Please see RN's note for documentation of vital signs and FHR which are stable.

## 2017-02-11 ENCOUNTER — Encounter (HOSPITAL_COMMUNITY): Payer: Self-pay | Admitting: General Practice

## 2017-02-11 LAB — CBC
HEMATOCRIT: 28.1 % — AB (ref 36.0–46.0)
Hemoglobin: 9.6 g/dL — ABNORMAL LOW (ref 12.0–15.0)
MCH: 26.1 pg (ref 26.0–34.0)
MCHC: 34.2 g/dL (ref 30.0–36.0)
MCV: 76.4 fL — ABNORMAL LOW (ref 78.0–100.0)
PLATELETS: 193 10*3/uL (ref 150–400)
RBC: 3.68 MIL/uL — ABNORMAL LOW (ref 3.87–5.11)
RDW: 17.8 % — AB (ref 11.5–15.5)
WBC: 11.3 10*3/uL — AB (ref 4.0–10.5)

## 2017-02-11 MED ORDER — BENZOCAINE-MENTHOL 20-0.5 % EX AERO
1.0000 "application " | INHALATION_SPRAY | CUTANEOUS | Status: DC | PRN
  Administered 2017-02-13: 1 via TOPICAL
  Filled 2017-02-11: qty 56

## 2017-02-11 MED ORDER — ACETAMINOPHEN 325 MG PO TABS: 650.0000 mg | ORAL_TABLET | ORAL | Status: DC | PRN

## 2017-02-11 MED ORDER — DIBUCAINE 1 % RE OINT: 1.0000 "application " | TOPICAL_OINTMENT | RECTAL | Status: DC | PRN

## 2017-02-11 MED ORDER — WITCH HAZEL-GLYCERIN EX PADS: 1.0000 "application " | MEDICATED_PAD | CUTANEOUS | Status: DC | PRN

## 2017-02-11 MED ORDER — ZOLPIDEM TARTRATE 5 MG PO TABS: 5.0000 mg | ORAL_TABLET | Freq: Every evening | ORAL | Status: DC | PRN

## 2017-02-11 MED ORDER — PRENATAL MULTIVITAMIN CH
1.0000 | ORAL_TABLET | Freq: Every day | ORAL | Status: DC
  Administered 2017-02-11 – 2017-02-12 (×2): 1 via ORAL
  Filled 2017-02-11 (×2): qty 1

## 2017-02-11 MED ORDER — METHYLERGONOVINE MALEATE 0.2 MG/ML IJ SOLN: 0.2000 mg | INTRAMUSCULAR | Status: DC | PRN

## 2017-02-11 MED ORDER — TETANUS-DIPHTH-ACELL PERTUSSIS 5-2.5-18.5 LF-MCG/0.5 IM SUSP: 0.5000 mL | Freq: Once | INTRAMUSCULAR | Status: DC

## 2017-02-11 MED ORDER — IBUPROFEN 600 MG PO TABS
600.0000 mg | ORAL_TABLET | Freq: Four times a day (QID) | ORAL | Status: DC
  Administered 2017-02-11 – 2017-02-13 (×9): 600 mg via ORAL
  Filled 2017-02-11 (×10): qty 1

## 2017-02-11 MED ORDER — COCONUT OIL OIL
1.0000 | TOPICAL_OIL | Status: DC | PRN
Start: 2017-02-11 — End: 2017-02-13
  Administered 2017-02-12: 1 via TOPICAL
  Filled 2017-02-11: qty 120

## 2017-02-11 MED ORDER — OXYCODONE-ACETAMINOPHEN 5-325 MG PO TABS: 1.0000 | ORAL_TABLET | ORAL | Status: DC | PRN

## 2017-02-11 MED ORDER — DIPHENHYDRAMINE HCL 25 MG PO CAPS: 25.0000 mg | ORAL_CAPSULE | Freq: Four times a day (QID) | ORAL | Status: DC | PRN

## 2017-02-11 MED ORDER — ONDANSETRON HCL 4 MG PO TABS: 4.0000 mg | ORAL_TABLET | ORAL | Status: DC | PRN

## 2017-02-11 MED ORDER — ONDANSETRON HCL 4 MG/2ML IJ SOLN: 4.0000 mg | INTRAMUSCULAR | Status: DC | PRN

## 2017-02-11 MED ORDER — OXYTOCIN 40 UNITS IN LACTATED RINGERS INFUSION - SIMPLE MED: 2.5000 [IU]/h | INTRAVENOUS | Status: DC | PRN

## 2017-02-11 MED ORDER — SIMETHICONE 80 MG PO CHEW: 80.0000 mg | CHEWABLE_TABLET | ORAL | Status: DC | PRN

## 2017-02-11 MED ORDER — METHYLERGONOVINE MALEATE 0.2 MG PO TABS: 0.2000 mg | ORAL_TABLET | ORAL | Status: DC | PRN

## 2017-02-11 MED ORDER — SENNOSIDES-DOCUSATE SODIUM 8.6-50 MG PO TABS
2.0000 | ORAL_TABLET | ORAL | Status: DC
  Administered 2017-02-12: 2 via ORAL
  Filled 2017-02-11: qty 2

## 2017-02-11 MED ORDER — OXYCODONE-ACETAMINOPHEN 5-325 MG PO TABS: 2.0000 | ORAL_TABLET | ORAL | Status: DC | PRN

## 2017-02-11 NOTE — Anesthesia Postprocedure Evaluation (Signed)
Anesthesia Post Note  Patient: Brianna Ali  Procedure(s) Performed: AN AD HOC LABOR EPIDURAL     Patient location during evaluation: Mother Baby Anesthesia Type: Epidural Level of consciousness: awake, awake and alert, oriented and patient cooperative Pain management: pain level controlled Vital Signs Assessment: post-procedure vital signs reviewed and stable Respiratory status: spontaneous breathing, nonlabored ventilation and respiratory function stable Cardiovascular status: stable Postop Assessment: no headache, no backache, patient able to bend at knees and no apparent nausea or vomiting Anesthetic complications: no    Last Vitals:  Vitals:   02/11/17 0430 02/11/17 0530  BP: 114/78 (!) 97/58  Pulse: 77 76  Resp: 18 18  Temp: 36.9 C 36.9 C  SpO2: 99% 100%    Last Pain:  Vitals:   02/11/17 0553  TempSrc:   PainSc: 0-No pain   Pain Goal: Patients Stated Pain Goal: 2 (02/10/17 2312)               Jarrah Babich L

## 2017-02-11 NOTE — H&P (Signed)
Brianna Ali is a 32 y.o. female G2P1 at 738 weeks 1 day presenting for painful ctx and SROM. No vaginal bleeding, normal fetal movement.  Pregnancy uncomplicated  OB History    Gravida Para Term Preterm AB Living   2 2 2     2    SAB TAB Ectopic Multiple Live Births         0 1     Past Medical History:  Diagnosis Date  . Anemia   . BV (bacterial vaginosis)    Past Surgical History:  Procedure Laterality Date  . NO PAST SURGERIES     Family History: family history includes Diabetes in her maternal grandmother; Hypertension in her mother; Stroke in her mother. Social History:  reports that she has quit smoking. Her smoking use included cigars. she has never used smokeless tobacco. She reports that she drinks alcohol. She reports that she does not use drugs.     Maternal Diabetes: No Genetic Screening: Normal Maternal Ultrasounds/Referrals: Normal Fetal Ultrasounds or other Referrals:  None Maternal Substance Abuse:  No Significant Maternal Medications:  None Significant Maternal Lab Results:  Lab values include: Group B Strep positive Other Comments:  None  ROS History Blood pressure 100/69, pulse 73, temperature 98.3 F (36.8 C), temperature source Oral, resp. rate 16, height 5\' 2"  (1.575 m), weight 57.6 kg (127 lb), SpO2 100 %, unknown if currently breastfeeding. Maternal Exam:  Uterine Assessment: Contraction strength is moderate.  Contraction frequency is regular.   Abdomen: Fundal height is 38cm.   Estimated fetal weight is 3000 grams.   Fetal presentation: vertex  Introitus: Normal vulva. Normal vagina.  Ferning test: positive.  Amniotic fluid character: clear.     Fetal Exam Fetal Monitor Review: Baseline rate: 135.  Variability: moderate (6-25 bpm).   Pattern: accelerations present and no decelerations.    Fetal State Assessment: Category I - tracings are normal.     - IN MAU : 2-3/80/-2 SROM clear fluid  Physical Exam  Prenatal labs: ABO, Rh:  --/--/B POS, B POS (01/18 2119) Antibody: NEG (01/18 2119) Rubella: Immune (07/03 0000) RPR: Nonreactive (07/03 0000)  HBsAg: Negative (07/03 0000)  HIV: Non-reactive (07/03 0000)  GBS: Positive (12/28 0000)   Assessment/Plan: 32 yo G2P1 at 38 weeks in early active labor / SROM Admit to L&D Pitocin if augmentation needed Epidural on demand   Brianna Ali Brianna Ali 02/11/2017, 1:34 PM

## 2017-02-11 NOTE — Lactation Note (Signed)
This note was copied from a baby's chart. Lactation Consultation Note  Patient Name: Brianna Ali ZOXWR'UToday's Date: 02/11/2017 Reason for consult: Initial assessment;1st time breastfeeding;Early term 37-38.6wks Breastfeeding consultation services and support information given and reviewed.  This is mom's first time breastfeeding.  Baby has been latching easily and mom comfortable.  Baby skin to skin after bath.  Baby latched easily and well to breast.  Instructed to feed with any cue and use breast massage during the feeding.  Encouraged to call for assist prn.  Maternal Data Does the patient have breastfeeding experience prior to this delivery?: No  Feeding Feeding Type: Breast Fed  LATCH Score Latch: Grasps breast easily, tongue down, lips flanged, rhythmical sucking.  Audible Swallowing: A few with stimulation  Type of Nipple: Everted at rest and after stimulation  Comfort (Breast/Nipple): Soft / non-tender  Hold (Positioning): No assistance needed to correctly position infant at breast.  LATCH Score: 9  Interventions Interventions: Breast feeding basics reviewed  Lactation Tools Discussed/Used     Consult Status Consult Status: Follow-up Date: 02/12/17 Follow-up type: In-patient    [name], Linna Thebeau S 02/11/2017, 11:59 AM

## 2017-02-12 NOTE — Progress Notes (Signed)
Post Partum Day 1 Subjective: no complaints  Objective: Blood pressure (!) 107/55, pulse 79, temperature 98.7 F (37.1 C), temperature source Oral, resp. rate 18, height 5\' 2"  (1.575 m), weight 127 lb (57.6 kg), SpO2 100 %, unknown if currently breastfeeding.  Physical Exam:  General: alert and cooperative Lochia: appropriate Uterine Fundus: firm Incision:  DVT Evaluation: No evidence of DVT seen on physical exam.  Recent Labs    02/10/17 2119 02/11/17 0614  HGB 10.2* 9.6*  HCT 30.2* 28.1*   GBS positive Assessment/Plan: Plan for discharge tomorrow   LOS: 2 days   Elmore GuiseLori A Clemmons CNM 02/12/2017, 6:58 AM

## 2017-02-13 MED ORDER — IBUPROFEN 600 MG PO TABS: 600.0000 mg | ORAL_TABLET | Freq: Four times a day (QID) | ORAL | 0 refills | Status: DC

## 2017-02-13 NOTE — Lactation Note (Signed)
This note was copied from a baby's chart. Lactation Consultation Note  Patient Name: Brianna Ali RUEAV'WToday's Date: 02/13/2017 Reason for consult: Follow-up assessment Mom and baby for discharge today.  Mom reports baby is feeding well and cluster fed last night.  No questions or concerns at present time.  Lactation outpatient services and support information reviewed and encouraged prn.  Maternal Data    Feeding Length of feed: 0 min(showed interest, then was content + no latch)  LATCH Score                   Interventions    Lactation Tools Discussed/Used     Consult Status Consult Status: Complete    [name], [name] 02/13/2017, 10:43 AM

## 2017-02-13 NOTE — Discharge Summary (Signed)
OB Discharge Summary     Patient Name: Brianna Ali DOB: [birth year] MRN: [id number]  Date of admission: 02/10/2017 Delivering MD: Essie Hart   Date of discharge: 02/13/2017  Admitting diagnosis: 38wks CTX and pressure Intrauterine pregnancy: [redacted]w[redacted]d     Secondary diagnosis:  Active Problems:   Normal labor  Additional problems:      Discharge diagnosis: Term Pregnancy Delivered                                                                                                Post partum procedures:  Augmentation:   Complications: None  Hospital course:  Onset of Labor With Vaginal Delivery     32 y.o. yo [id number] at [redacted]w[redacted]d was admitted in Active Labor on 02/10/2017. Patient had an uncomplicated labor course as follows:  Membrane Rupture Time/Date: 10:00 PM ,02/10/2017   Intrapartum Procedures: Episiotomy: None [1]                                         Lacerations:  None [1]  Patient had a delivery of a Viable infant. 02/11/2017  Information for the patient's newborn:  Kensleigh, Gates [[id number]]  Delivery Method: Vag-Spont    Pateint had an uncomplicated postpartum course.  She is ambulating, tolerating a regular diet, passing flatus, and urinating well. Patient is discharged home in stable condition on 02/13/17.   Physical exam  Vitals:   02/11/17 1700 02/12/17 0605 02/12/17 1951 02/13/17 0617  BP: 104/69 (!) 107/55 93/63 98/61   Pulse: 84 79 100 89  Resp:  18 18 16   Temp:  98.7 F (37.1 C) 98.2 F (36.8 C) 98.8 F (37.1 C)  TempSrc:  Oral Oral Oral  SpO2:  100%    Weight:      Height:       General: alert, cooperative and no distress Lochia: appropriate Uterine Fundus: firm Incision: N/A DVT Evaluation: No evidence of DVT seen on physical exam. Labs: Lab Results  Component Value Date   WBC 11.3 (H) 02/11/2017   HGB 9.6 (L) 02/11/2017   HCT 28.1 (L) 02/11/2017   MCV 76.4 (L) 02/11/2017   PLT 193 02/11/2017   CMP Latest Ref Rng & Units 07/01/2016   Glucose 65 - 99 mg/dL 88  BUN 6 - 20 mg/dL 10  Creatinine 1.30 - 8.65 mg/dL 7.84  Sodium 696 - 295 mmol/L 135  Potassium 3.5 - 5.1 mmol/L 3.9  Chloride 101 - 111 mmol/L 103  CO2 22 - 32 mmol/L 24  Calcium 8.9 - 10.3 mg/dL 9.2  Total Protein 6.5 - 8.1 g/dL 7.4  Total Bilirubin 0.3 - 1.2 mg/dL 0.3  Alkaline Phos 38 - 126 U/L 37(L)  AST 15 - 41 U/L 18  ALT 14 - 54 U/L 11(L)    Discharge instruction: per After Visit Summary and "Baby and Me Booklet".  After visit meds:  Allergies as of 02/13/2017      Reactions   Flagyl [metronidazole] Hives   Ciprofloxacin Rash  Medication List    TAKE these medications   ferrous sulfate 325 (65 FE) MG tablet Take 325 mg by mouth daily with breakfast.   ibuprofen 600 MG tablet Commonly known as:  ADVIL,MOTRIN Take 1 tablet (600 mg total) by mouth every 6 (six) hours.   prenatal multivitamin Tabs tablet Take 1 tablet by mouth daily at 12 noon.       Diet: routine diet  Activity: Advance as tolerated. Pelvic rest for 6 weeks.   Outpatient follow up:6 weeks Follow up Appt:No future appointments. Follow up Visit:No Follow-up on file.  Postpartum contraception: Undecided  Newborn Data: Live born female  Birth Weight: 6 lb 8.1 oz (2951 g) APGAR: 8, 9  Newborn Delivery   Birth date/time:  02/11/2017 01:48:00 Delivery type:  Vaginal, Spontaneous     Baby Feeding: Breast Disposition:home with mother   02/13/2017 Rhea PinkLori A Clemmons, CNM

## 2017-02-13 NOTE — Discharge Instructions (Signed)
Vaginal Delivery, Care After °Refer to this sheet in the next few weeks. These instructions provide you with information about caring for yourself after vaginal delivery. Your health care provider may also give you more specific instructions. Your treatment has been planned according to current medical practices, but problems sometimes occur. Call your health care provider if you have any problems or questions. °What can I expect after the procedure? °After vaginal delivery, it is common to have: °· Some bleeding from your vagina. °· Soreness in your abdomen, your vagina, and the area of skin between your vaginal opening and your anus (perineum). °· Pelvic cramps. °· Fatigue. ° °Follow these instructions at home: °Medicines °· Take over-the-counter and prescription medicines only as told by your health care provider. °· If you were prescribed an antibiotic medicine, take it as told by your health care provider. Do not stop taking the antibiotic until it is finished. °Driving ° °· Do not drive or operate heavy machinery while taking prescription pain medicine. °· Do not drive for 24 hours if you received a sedative. °Lifestyle °· Do not drink alcohol. This is especially important if you are breastfeeding or taking medicine to relieve pain. °· Do not use tobacco products, including cigarettes, chewing tobacco, or e-cigarettes. If you need help quitting, ask your health care provider. °Eating and drinking °· Drink at least 8 eight-ounce glasses of water every day unless you are told not to by your health care provider. If you choose to breastfeed your baby, you may need to drink more water than this. °· Eat high-fiber foods every day. These foods may help prevent or relieve constipation. High-fiber foods include: °? Whole grain cereals and breads. °? Brown rice. °? Beans. °? Fresh fruits and vegetables. °Activity °· Return to your normal activities as told by your health care provider. Ask your health care provider  what activities are safe for you. °· Rest as much as possible. Try to rest or take a nap when your baby is sleeping. °· Do not lift anything that is heavier than your baby or 10 lb (4.5 kg) until your health care provider says that it is safe. °· Talk with your health care provider about when you can engage in sexual activity. This may depend on your: °? Risk of infection. °? Rate of healing. °? Comfort and desire to engage in sexual activity. °Vaginal Care °· If you have an episiotomy or a vaginal tear, check the area every day for signs of infection. Check for: °? More redness, swelling, or pain. °? More fluid or blood. °? Warmth. °? Pus or a bad smell. °· Do not use tampons or douches until your health care provider says this is safe. °· Watch for any blood clots that may pass from your vagina. These may look like clumps of dark red, brown, or black discharge. °General instructions °· Keep your perineum clean and dry as told by your health care provider. °· Wear loose, comfortable clothing. °· Wipe from front to back when you use the toilet. °· Ask your health care provider if you can shower or take a bath. If you had an episiotomy or a perineal tear during labor and delivery, your health care provider may tell you not to take baths for a certain length of time. °· Wear a bra that supports your breasts and fits you well. °· If possible, have someone help you with household activities and help care for your baby for at least a few days after   you leave the hospital. °· Keep all follow-up visits for you and your baby as told by your health care provider. This is important. °Contact a health care provider if: °· You have: °? Vaginal discharge that has a bad smell. °? Difficulty urinating. °? Pain when urinating. °? A sudden increase or decrease in the frequency of your bowel movements. °? More redness, swelling, or pain around your episiotomy or vaginal tear. °? More fluid or blood coming from your episiotomy or  vaginal tear. °? Pus or a bad smell coming from your episiotomy or vaginal tear. °? A fever. °? A rash. °? Little or no interest in activities you used to enjoy. °? Questions about caring for yourself or your baby. °· Your episiotomy or vaginal tear feels warm to the touch. °· Your episiotomy or vaginal tear is separating or does not appear to be healing. °· Your breasts are painful, hard, or turn red. °· You feel unusually sad or worried. °· You feel nauseous or you vomit. °· You pass large blood clots from your vagina. If you pass a blood clot from your vagina, save it to show to your health care provider. Do not flush blood clots down the toilet without having your health care provider look at them. °· You urinate more than usual. °· You are dizzy or light-headed. °· You have not breastfed at all and you have not had a menstrual period for 12 weeks after delivery. °· You have stopped breastfeeding and you have not had a menstrual period for 12 weeks after you stopped breastfeeding. °Get help right away if: °· You have: °? Pain that does not go away or does not get better with medicine. °? Chest pain. °? Difficulty breathing. °? Blurred vision or spots in your vision. °? Thoughts about hurting yourself or your baby. °· You develop pain in your abdomen or in one of your legs. °· You develop a severe headache. °· You faint. °· You bleed from your vagina so much that you fill two sanitary pads in one hour. °This information is not intended to replace advice given to you by your health care provider. Make sure you discuss any questions you have with your health care provider. °Document Released: 01/08/2000 Document Revised: 06/24/2015 Document Reviewed: 01/25/2015 °Elsevier Interactive Patient Education © 2018 [employer] ° °Home Care Instructions for Mom °ACTIVITY °· Gradually return to your regular activities. °· Let yourself rest. Nap while your baby sleeps. °· Avoid lifting anything that is heavier than 10 lb  (4.5 kg) until your health care provider says it is okay. °· Avoid activities that take a lot of effort and energy (are strenuous) until approved by your health care provider. Walking at a slow-to-moderate pace is usually safe. °· If you had a cesarean delivery: °? Do not vacuum, climb stairs, or drive a car for 4-6 weeks. °? Have someone help you at home until you feel like you can do your usual activities yourself. °? Do exercises as told by your health care provider, if this applies. ° °VAGINAL BLEEDING °You may continue to bleed for 4-6 weeks after delivery. Over time, the amount of blood usually decreases and the color of the blood usually gets lighter. However, the flow of bright red blood may increase if you have been too active. If you need to use more than one pad in an hour because your pad gets soaked, or if you pass a large clot: °· Lie down. °· Raise your feet. °·   Place a cold compress on your lower abdomen. °· Rest. °· Call your health care provider. ° °If you are breastfeeding, your period should return anytime between 8 weeks after delivery and the time that you stop breastfeeding. If you are not breastfeeding, your period should return 6-8 weeks after delivery. °PERINEAL CARE °The perineal area, or perineum, is the part of your body between your thighs. After delivery, this area needs special care. Follow these instructions as told by your health care provider. °· Take warm tub baths for 15-20 minutes. °· Use medicated pads and pain-relieving sprays and creams as told. °· Do not use tampons or douches until vaginal bleeding has stopped. °· Each time you go to the bathroom: °? Use a peri bottle. °? Change your pad. °? Use towelettes in place of toilet paper until your stitches have healed. °· Do Kegel exercises every day. Kegel exercises help to maintain the muscles that support the vagina, bladder, and bowels. You can do these exercises while you are standing, sitting, or lying down. To do Kegel  exercises: °? Tighten the muscles of your abdomen and the muscles that surround your birth canal. °? Hold for a few seconds. °? Relax. °? Repeat until you have done this 5 times in a row. °· To prevent hemorrhoids from developing or getting worse: °? Drink enough fluid to keep your urine clear or pale yellow. °? Avoid straining when having a bowel movement. °? Take over-the-counter medicines and stool softeners as told by your health care provider. ° °BREAST CARE °· Wear a tight-fitting bra. °· Avoid taking over-the-counter pain medicine for breast discomfort. °· Apply ice to the breasts to help with discomfort as needed: °? Put ice in a plastic bag. °? Place a towel between your skin and the bag. °? Leave the ice on for 20 minutes or as told by your health care provider. ° °NUTRITION °· Eat a well-balanced diet. °· Do not try to lose weight quickly by cutting back on calories. °· Take your prenatal vitamins until your postpartum checkup or until your health care provider tells you to stop. ° °POSTPARTUM DEPRESSION °You may find yourself crying for no apparent reason and unable to cope with all of the changes that come with having a newborn. This mood is called postpartum depression. Postpartum depression happens because your hormone levels change after delivery. If you have postpartum depression, get support from your partner, friends, and family. If the depression does not go away on its own after several weeks, contact your health care provider. °BREAST SELF-EXAM °Do a breast self-exam each month, at the same time of the month. If you are breastfeeding, check your breasts just after a feeding, when your breasts are less full. If you are breastfeeding and your period has started, check your breasts on day 5, 6, or 7 of your period. °Report any lumps, bumps, or discharge to your health care provider. Know that breasts are normally lumpy if you are breastfeeding. This is temporary, and it is not a health  risk. °INTIMACY AND SEXUALITY °Avoid sexual activity for at least 3-4 weeks after delivery or until the brownish-red vaginal flow is completely gone. If you want to avoid pregnancy, use some form of birth control. You can get pregnant after delivery, even if you have not had your period. °SEEK MEDICAL CARE IF: °· You feel unable to cope with the changes that a child brings to your life, and these feelings do not go away after several weeks. °·   You notice a lump, a bump, or discharge on your breast. ° °SEEK IMMEDIATE MEDICAL CARE IF: °· Blood soaks your pad in 1 hour or less. °· You have: °? Severe pain or cramping in your lower abdomen. °? A bad-smelling vaginal discharge. °? A fever that is not controlled by medicine. °? A fever, and an area of your breast is red and sore. °? Pain or redness in your calf. °? Sudden, severe chest pain. °? Shortness of breath. °? Painful or bloody urination. °? Problems with your vision. °· You vomit for 12 hours or longer. °· You develop a severe headache. °· You have serious thoughts about hurting yourself, your child, or anyone else. ° °This information is not intended to replace advice given to you by your health care provider. Make sure you discuss any questions you have with your health care provider. °Document Released: 01/08/2000 Document Revised: 06/18/2015 Document Reviewed: 07/14/2014 °[employer] Interactive Patient Education © 2017 [employer] ° °

## 2017-02-14 LAB — RPR

## 2017-03-21 ENCOUNTER — Encounter (HOSPITAL_COMMUNITY): Payer: Self-pay

## 2017-03-21 ENCOUNTER — Inpatient Hospital Stay (HOSPITAL_COMMUNITY)
Admission: AD | Admit: 2017-03-21 | Discharge: 2017-03-21 | Disposition: A | Discharge status: Home / Self Care | Payer: Medicaid Other | Source: Ambulatory Visit | Attending: Obstetrics & Gynecology | Admitting: Obstetrics & Gynecology

## 2017-03-21 DIAGNOSIS — O9122 Nonpurulent mastitis associated with the puerperium: Secondary | ICD-10-CM

## 2017-03-21 DIAGNOSIS — O9089 Other complications of the puerperium, not elsewhere classified: Secondary | ICD-10-CM | POA: Diagnosis not present

## 2017-03-21 DIAGNOSIS — N61 Mastitis without abscess: Secondary | ICD-10-CM | POA: Diagnosis not present

## 2017-03-21 DIAGNOSIS — N644 Mastodynia: Secondary | ICD-10-CM | POA: Diagnosis not present

## 2017-03-21 DIAGNOSIS — Z87891 Personal history of nicotine dependence: Secondary | ICD-10-CM | POA: Diagnosis not present

## 2017-03-21 MED ORDER — DICLOXACILLIN SODIUM 500 MG PO CAPS: 500.0000 mg | ORAL_CAPSULE | Freq: Three times a day (TID) | ORAL | 0 refills | Status: AC

## 2017-03-21 MED ORDER — IBUPROFEN 600 MG PO TABS
600.0000 mg | ORAL_TABLET | Freq: Once | ORAL | Status: AC
  Administered 2017-03-21: 600 mg via ORAL
  Filled 2017-03-21: qty 1

## 2017-03-21 MED ORDER — DICLOXACILLIN SODIUM 500 MG PO CAPS: 500.0000 mg | ORAL_CAPSULE | Freq: Once | ORAL | Status: DC

## 2017-03-21 NOTE — MAU Provider Note (Signed)
  History     CSN: [id number]  Arrival date and time: 03/21/17 2030   None     Chief Complaint  Patient presents with  . Postpartum Complications  . Breast Problem   Pt reports left breast pain and soreness and redness today. States she called Advertising copywriterlactation consultant and was told to take motrin-took around 3:30pm and used cold presses. States she pumped at 7pm. States she started having chills and flu-like symptoms at that time. SVD on 02/11/2017.       Past Medical History:  Diagnosis Date  . Anemia   . BV (bacterial vaginosis)     Past Surgical History:  Procedure Laterality Date  . NO PAST SURGERIES      Family History  Problem Relation Age of Onset  . Hypertension Mother   . Stroke Mother   . Diabetes Maternal Grandmother     Social History   Tobacco Use  . Smoking status: Former Smoker    Types: Cigars  . Smokeless tobacco: Never Used  Substance Use Topics  . Alcohol use: Yes    Comment: none since pregnancy  . Drug use: No    Allergies:  Allergies  Allergen Reactions  . Flagyl [Metronidazole] Hives  . Ciprofloxacin Rash    Medications Prior to Admission  Medication Sig Dispense Refill Last Dose  . ferrous sulfate 325 (65 FE) MG tablet Take 325 mg by mouth daily with breakfast.   02/10/2017 at Unknown time  . ibuprofen (ADVIL,MOTRIN) 600 MG tablet Take 1 tablet (600 mg total) by mouth every 6 (six) hours. 30 tablet 0   . Prenatal Vit-Fe Fumarate-FA (PRENATAL MULTIVITAMIN) TABS tablet Take 1 tablet by mouth daily at 12 noon.   02/10/2017 at Unknown time    Review of Systems  Reason unable to perform ROS: swollen painful left breast.  Constitutional: Positive for chills.   Physical Exam   Blood pressure 126/88, pulse (!) 126, temperature (!) 102.7 F (39.3 C), temperature source Oral, resp. rate 16, height 5\' 2"  (1.575 m), weight 107 lb (48.5 kg), SpO2 99 %, unknown if currently breastfeeding.  Physical Exam  Nursing note and vitals  reviewed. Constitutional: She is oriented to person, place, and time. She appears well-developed and well-nourished. No distress.  Respiratory: Effort normal and breath sounds normal. No respiratory distress.  Left breast tender to touch, hot and engorged  GI: Soft. She exhibits no distension.  Musculoskeletal: Normal range of motion.  Neurological: She is alert and oriented to person, place, and time.  Skin: Skin is warm and dry.  Psychiatric: She has a normal mood and affect. Her behavior is normal.    MAU Course  Procedures  MDM   Assessment and Plan  Mastitis  WU:[id number]:Ibuprofen 600mg  q 6  Dicloxacillin 500mg  TID x 14 days Call office for follow up appointment on Friday if not feeling better Continue pumping  Lori A Clemmons CNM 03/21/2017, 9:32 PM

## 2017-03-21 NOTE — MAU Note (Addendum)
Pt reports left breast pain and soreness and redness today. States she called Advertising copywriterlactation consultant and was told to take motrin-took around 3:30pm and used cold presses. States she pumped at 7pm. States she started having chills and flu-like symptoms at that time. SVD on 02/11/2017.

## 2017-03-21 NOTE — Discharge Instructions (Signed)
Breastfeeding and Mastitis  Mastitis is inflammation of the breast tissue. It can occur in women who are breastfeeding. This can make breastfeeding painful. Mastitis will sometimes go away on its own, especially if it is not caused by an infection (non-infectious mastitis). Your health care provider will help determine if medical treatment is needed. Treatment may be needed if the condition is caused by a bacterial infection (infectious mastitis).  What are the causes?  This condition is often associated with a blocked milkduct, which can happen when too much milk builds up in the breast. Causes of excess milk in the breast can include:  · Poor latch-on. If your baby is not latched onto the breast properly, he or she may not empty your breast completely while breastfeeding.  · Allowing too much time to pass between feedings.  · Wearing a bra or other clothing that is too tight. This puts extra pressure on the milk ducts so milk does not flow through them as it should.  · Milk remaining in the breast because it is overfilled (engorged).  · Stress and fatigue.    Mastitis can also be caused by a bacterial infection. Bacteria may enter the breast tissue through cuts, cracks, or openings in the skin near the nipple area. Cracks in the skin are often caused when your baby does not latch on properly to the breast.  What are the signs or symptoms?  Symptoms of this condition include:  · Swelling, redness, tenderness, and pain in an area of the breast. This usually affects the upper part of the breast, toward the armpit region. In most cases, it affects only one breast. In some cases, it may occur on both breasts at the same time and affect a larger portion of breast tissue.  · Swelling of the glands under the arm on the same side.  · Fatigue, headache, and flu-like muscle aches.  · Fever.  · Rapid pulse.    Symptoms usually last 2 to 5 days. Breast pain and redness are at their worst on day 2 and day 3, and they usually go  away by day 5. If an infection is left to progress, a collection of pus (abscess) may develop.  How is this diagnosed?  This condition can be diagnosed based on your symptoms and a physical exam. You may also have tests, such as:  · Blood tests to determine if your body is fighting a bacterial infection.  · Mammogram or ultrasound tests to rule out other problems or diseases.  · Fluid tests. If an abscess has developed, the fluid in the abscess may be removed with a needle. The fluid may be analyzed to determine if bacteria are present.  · Breast milk may be cultured and tested for bacteria.    How is this treated?  This condition will sometimes go away on its own. Your health care provider may choose to wait 24 hours after first seeing you to decide whether treatment is needed. If treatment is needed, it may include:  · Strategies to manage breastfeeding. This includes continuing to breastfeed or pump in order to allow adequate milk flow, using breast massage, and applying heat or cold to the affected area.  · Self-care such as rest and increased fluid intake.  · Medicine for pain.  · Antibiotic medicine to treat a bacterial infection. This is usually taken by mouth.  · If an abscess has developed, it may be treated by removing fluid with a needle.      Follow these instructions at home:  Medicines  · Take over-the-counter and prescription medicines only as told by your health care provider.  · If you were prescribed an antibiotic medicine, take it as told by your health care provider. Do not stop taking the antibiotic even if you start to feel better.  General instructions  · Do not wear a tight or underwire bra. Wear a soft, supportive bra.  · Increase your fluid intake, especially if you have a fever.  · Get plenty of rest.  For breastfeeding:  · Continue to empty your breasts as often as possible, either by breastfeeding or using an electric breast pump. This will lower the pressure and the pain that comes with  it. Ask your health care provider if changes need to be made to your breastfeeding or pumping routine.  · Keep your nipples clean and dry.  · During breastfeeding, empty the first breast completely before going to the other breast. If your baby is not emptying your breasts completely, use a breast pump to empty your breasts.  · Use breast massage during feeding or pumping sessions.  · If directed, apply moist heat to the affected area of your breast right before breastfeeding or pumping. Use the heat source that your health care provider recommends.  · If directed, put ice on the affected area of your breast right after breastfeeding or pumping:  ? Put ice in a plastic bag.  ? Place a towel between your skin and the bag.  ? Leave the ice on for 20 minutes.  · If you go back to work, pump your breasts while at work to stay in time with your nursing schedule.  · Do not allow your breasts to become engorged.  Contact a health care provider if:  · You have pus-like discharge from the breast.  · You have a fever.  · Your symptoms do not improve within 2 days of starting treatment.  · Your symptoms return after you have recovered from a breast infection.  Get help right away if:  · Your pain and swelling are getting worse.  · You have pain that is not controlled with medicine.  · You have a red line extending from the breast toward your armpit.  Summary  · Mastitis is inflammation of the breast tissue. It is often caused by a blocked milk duct or bacteria.  · This condition may be treated with hot and cold compresses, medicines, self-care, and certain breastfeeding strategies.  · If you were prescribed an antibiotic medicine, take it as told by your health care provider. Do not stop taking the antibiotic even if you start to feel better.  · Continue to empty your breasts as often as possible either by breastfeeding or using an electric breast pump.  This information is not intended to replace advice given to you by your  health care provider. Make sure you discuss any questions you have with your health care provider.  Document Released: 05/07/2004 Document Revised: 01/12/2016 Document Reviewed: 01/12/2016  Elsevier Interactive Patient Education © 2017 [employer]

## 2017-09-07 ENCOUNTER — Emergency Department (HOSPITAL_COMMUNITY)
Admission: EM | Admit: 2017-09-07 | Discharge: 2017-09-07 | Disposition: A | Discharge status: Home / Self Care | Payer: Self-pay | Attending: Emergency Medicine | Admitting: Emergency Medicine

## 2017-09-07 ENCOUNTER — Emergency Department (HOSPITAL_COMMUNITY): Payer: Self-pay

## 2017-09-07 ENCOUNTER — Other Ambulatory Visit: Payer: Self-pay

## 2017-09-07 ENCOUNTER — Encounter (HOSPITAL_COMMUNITY): Payer: Self-pay

## 2017-09-07 DIAGNOSIS — Y929 Unspecified place or not applicable: Secondary | ICD-10-CM | POA: Insufficient documentation

## 2017-09-07 DIAGNOSIS — S61209A Unspecified open wound of unspecified finger without damage to nail, initial encounter: Secondary | ICD-10-CM | POA: Insufficient documentation

## 2017-09-07 DIAGNOSIS — Z87891 Personal history of nicotine dependence: Secondary | ICD-10-CM | POA: Insufficient documentation

## 2017-09-07 DIAGNOSIS — X58XXXA Exposure to other specified factors, initial encounter: Secondary | ICD-10-CM | POA: Insufficient documentation

## 2017-09-07 DIAGNOSIS — Y999 Unspecified external cause status: Secondary | ICD-10-CM | POA: Insufficient documentation

## 2017-09-07 DIAGNOSIS — Z79899 Other long term (current) drug therapy: Secondary | ICD-10-CM | POA: Insufficient documentation

## 2017-09-07 DIAGNOSIS — Y939 Activity, unspecified: Secondary | ICD-10-CM | POA: Insufficient documentation

## 2017-09-07 NOTE — ED Triage Notes (Addendum)
Patient reports that she pinched her left pointer finger at work a month ago and it still has not completely healed. Patient states she still has bleeding if she touches it on something.

## 2017-09-07 NOTE — Discharge Instructions (Addendum)
Your x-ray looks good, I suspect this wound is having difficulty healing just because of its location and then it is frequently bumps.  Use finger splint and dressing to try and protect the area.  Follow-up with your primary care doctor next week for a wound check.  Return to the emergency department for revaluation if you develop fevers, redness or swelling surrounding the wound, purulent or foul-smelling drainage or any other new or concerning symptoms.

## 2017-09-07 NOTE — ED Provider Notes (Signed)
[facility]-EMERGENCY DEPT Provider Note   CSN: [id number] Arrival date & time: 09/07/17  1335     History   Chief Complaint Chief Complaint  Patient presents with  . pain of the finger    HPI Brianna Ali is a 32 y.o. female.  Brianna Ali is a 32 y.o. Female who is otherwise healthy, presents to the ED for evaluation of injury to her left index finger.  Patient reports a little over a month ago at work she thinks she pinched the tip of her left index finger, initially she saw what looked like a blood blister under the skin, and this seemed to come to the surface and open up since then she has had a small wound present over the finger pad which will not completely heal.  She reports she bumps it on anything or touches anything it starts to bleed and has become increasingly tender.  She has not noticed surrounding redness or swelling and has not noticed any purulent drainage from the area but is just concerned that this still has not healed completely after a month.  She reports it was bleeding earlier today, but has since scabbed over.  She has no difficulty moving the finger, no numbness or weakness.  No evidence of injury or damage to the nail.  No fevers or chills.  She has been covering it with antibiotic ointment and wrapping it and a bandage has not tried anything else to treat her symptoms.     Past Medical History:  Diagnosis Date  . Anemia   . BV (bacterial vaginosis)     Patient Active Problem List   Diagnosis Date Noted  . Normal labor 02/10/2017    Past Surgical History:  Procedure Laterality Date  . NO PAST SURGERIES       OB History    Gravida  2   Para  2   Term  2   Preterm      AB      Living  2     SAB      TAB      Ectopic      Multiple  0   Live Births  1            Home Medications    Prior to Admission medications   Medication Sig Start Date End Date Taking? Authorizing Provider  ferrous sulfate 325  (65 FE) MG tablet Take 325 mg by mouth daily with breakfast.    [provider]  ibuprofen (ADVIL,MOTRIN) 600 MG tablet Take 1 tablet (600 mg total) by mouth every 6 (six) hours. 02/13/17   Clemmons, Elmore GuiseLori A, CNM  Prenatal Vit-Fe Fumarate-FA (PRENATAL MULTIVITAMIN) TABS tablet Take 1 tablet by mouth daily at 12 noon.    [provider]    Family History Family History  Problem Relation Age of Onset  . Hypertension Mother   . Stroke Mother   . Diabetes Maternal Grandmother     Social History Social History   Tobacco Use  . Smoking status: Former Smoker    Types: Cigars  . Smokeless tobacco: Never Used  Substance Use Topics  . Alcohol use: Yes    Comment: none since pregnancy  . Drug use: No     Allergies   Flagyl [metronidazole] and Ciprofloxacin   Review of Systems Review of Systems  Constitutional: Negative for chills and fever.  Musculoskeletal: Negative for arthralgias and joint swelling.  Skin: Positive for wound.  Negative for color change and rash.  Neurological: Negative for weakness and numbness.     Physical Exam Updated Vital Signs BP 107/78 (BP Location: Right Arm)   Pulse 99   Temp 98.3 F (36.8 C) (Oral)   Resp 16   Ht 5\' 2"  (1.575 m)   Wt 48.1 kg   LMP 08/14/2017   SpO2 100%   BMI 19.39 kg/m   Physical Exam  Constitutional: She appears well-developed and well-nourished. No distress.  HENT:  Head: Normocephalic and atraumatic.  Eyes: Right eye exhibits no discharge. Left eye exhibits no discharge.  Pulmonary/Chest: Effort normal. No respiratory distress.  Musculoskeletal:  0.5 cm wound to the finger pad of the left index finger as pictured below, it is tender to palpation without expressible drainage, there is no surrounding erythema or swelling, there is no evidence of injury to the nailbed or subungual hematoma.  Area appears to be an ulceration, without obvious repairable tissue. Patient able to flex and extend the finger  without difficulty, normal strength at DIP and PIP, good capillary refill and sensation intact.  Neurological: She is alert. Coordination normal.  Skin: Skin is warm and dry. She is not diaphoretic.  Psychiatric: She has a normal mood and affect. Her behavior is normal.  Nursing note and vitals reviewed.      ED Treatments / Results  Labs (all labs ordered are listed, but only abnormal results are displayed) Labs Reviewed - No data to display  EKG None  Radiology Dg Finger Index Left  Result Date: 09/07/2017 CLINICAL DATA:  Left index finger wound after injury. EXAM: LEFT INDEX FINGER 2+V COMPARISON:  None. FINDINGS: There is no evidence of fracture or dislocation. There is no evidence of arthropathy or other focal bone abnormality. Soft tissues are unremarkable. IMPRESSION: Normal left index finger. Electronically Signed   By: Lupita Raider, M.D.   On: 09/07/2017 15:00    Procedures Procedures (including critical care time)  Medications Ordered in ED Medications - No data to display   Initial Impression / Assessment and Plan / ED Course  I have reviewed the triage vital signs and the nursing notes.  Pertinent labs & imaging results that were available during my care of the patient were reviewed by me and considered in my medical decision making (see chart for details).  Patient presents with small wound to the left index finger over the finger pad, has been present for greater than 1 month after she pinched her finger and will not heal completely.  She reports the area frequently begins bleeding after she touches or bumps her finger on something.  Finger is neurovascularly intact there is tenderness directly over the wound but no surrounding erythema or swelling and no expressible purulent drainage.  Exam not concerning for flexor tenosynovitis.  Will get x-ray to look for any bony abnormality or infection within the bone as cause for delayed wound healing.  X-ray shows no  evidence of bony abnormality or infection.  Will place patient in fingertip splint with dressing to try and protect the area I suspect it is not healing just because of its location and that it is frequently bumped knocking off the scab that has started to form.  Discussed signs of infection that should warrant sooner return.  Patient follow-up with her primary care doctor for wound check.  Patient expresses understanding and is in agreement with this plan.  Final Clinical Impressions(s) / ED Diagnoses   Final diagnoses:  Open wound of  finger, initial encounter    ED Discharge Orders    None       Legrand RamsFord, Azana Kiesler N, PA-C 09/07/17 1510    Gwyneth SproutPlunkett, Whitney, MD 09/07/17 2144

## 2017-10-01 ENCOUNTER — Other Ambulatory Visit: Payer: Self-pay

## 2017-10-01 ENCOUNTER — Emergency Department (HOSPITAL_COMMUNITY)
Admission: EM | Admit: 2017-10-01 | Discharge: 2017-10-01 | Disposition: A | Discharge status: Home / Self Care | Payer: Medicaid Other | Attending: Emergency Medicine | Admitting: Emergency Medicine

## 2017-10-01 ENCOUNTER — Encounter (HOSPITAL_COMMUNITY): Payer: Self-pay | Admitting: Emergency Medicine

## 2017-10-01 DIAGNOSIS — X58XXXA Exposure to other specified factors, initial encounter: Secondary | ICD-10-CM | POA: Insufficient documentation

## 2017-10-01 DIAGNOSIS — Z87891 Personal history of nicotine dependence: Secondary | ICD-10-CM | POA: Insufficient documentation

## 2017-10-01 DIAGNOSIS — Y939 Activity, unspecified: Secondary | ICD-10-CM | POA: Insufficient documentation

## 2017-10-01 DIAGNOSIS — Y999 Unspecified external cause status: Secondary | ICD-10-CM | POA: Insufficient documentation

## 2017-10-01 DIAGNOSIS — S61201A Unspecified open wound of left index finger without damage to nail, initial encounter: Secondary | ICD-10-CM | POA: Insufficient documentation

## 2017-10-01 DIAGNOSIS — L989 Disorder of the skin and subcutaneous tissue, unspecified: Secondary | ICD-10-CM

## 2017-10-01 DIAGNOSIS — Y929 Unspecified place or not applicable: Secondary | ICD-10-CM | POA: Insufficient documentation

## 2017-10-01 MED ORDER — DOXYCYCLINE HYCLATE 100 MG PO CAPS: 100.0000 mg | ORAL_CAPSULE | Freq: Two times a day (BID) | ORAL | 0 refills | Status: AC

## 2017-10-01 NOTE — Discharge Instructions (Addendum)
You were given a prescription for antibiotics. Please take the antibiotic prescription fully.   Please follow up with your primary care provider within 5-7 days for re-evaluation of your symptoms. If you do not have a primary care provider, information for a healthcare clinic has been provided for you to make arrangements for follow up care.   Please follow up with dermatology. Information was provided for you to follow up with dermatology.  Please return to the emergency department for any new or worsening symptoms.

## 2017-10-01 NOTE — ED Triage Notes (Signed)
Pt has  5 [place] Progression Recent Vital Signs   There were no vitals taken for this visit.   Past Medical History:  Diagnosis Date  . Anemia   . BV (bacterial vaginosis)      Expected Discharge Date     Diet Order    None       VTE Documentation      Work Intensity Score/Level of Care     @LEVELOFCARE @   Mobility        Significant Events    DC Barriers   Abnormal Labs:  Brianna Ali 10/01/2017, 12:16 PMPt has a .5cm bister  on right index finger . Pt stated that she does not have a PCP.

## 2017-10-01 NOTE — ED Provider Notes (Signed)
[facility]-EMERGENCY DEPT Provider Note   CSN: [id number] Arrival date & time: 10/01/17  1132     History   Chief Complaint Chief Complaint  Patient presents with  . Hand Pain    blister on r/index finger    HPI Brianna Ali is a 32 y.o. female.  HPI   Patient is a 32 year old female who presents emergency department today for evaluation of the left index finger wound that has been present for greater than 1 month.  Patient thinks that she may have pinched the finger while she was at work about a month ago but is unsure of how the lesion started.  States she was seen in the ED recently for evaluation of this wound and was given a left finger splint.  Records reviewed and patient had negative left finger x-ray at that time.  Since then she states that she developed a scab to the area.  While she was at a water park last week the scab fell off and she noted some drainage from the wound.  She used hydrogen peroxide to try to clean the wound and she later developed a red swollen area to the left index finger that is painful to touch.   Past Medical History:  Diagnosis Date  . Anemia   . BV (bacterial vaginosis)     Patient Active Problem List   Diagnosis Date Noted  . Normal labor 02/10/2017    Past Surgical History:  Procedure Laterality Date  . NO PAST SURGERIES       OB History    Gravida  2   Para  2   Term  2   Preterm      AB      Living  2     SAB      TAB      Ectopic      Multiple  0   Live Births  1            Home Medications    Prior to Admission medications   Medication Sig Start Date End Date Taking? Authorizing Provider  doxycycline (VIBRAMYCIN) 100 MG capsule Take 1 capsule (100 mg total) by mouth 2 (two) times daily for 5 days. 10/01/17 10/06/17  Shaylinn Hladik S, PA-C  ferrous sulfate 325 (65 FE) MG tablet Take 325 mg by mouth daily with breakfast.    [provider]  ibuprofen (ADVIL,MOTRIN) 600 MG  tablet Take 1 tablet (600 mg total) by mouth every 6 (six) hours. 02/13/17   Clemmons, Elmore Guise, CNM  Prenatal Vit-Fe Fumarate-FA (PRENATAL MULTIVITAMIN) TABS tablet Take 1 tablet by mouth daily at 12 noon.    [provider]    Family History Family History  Problem Relation Age of Onset  . Hypertension Mother   . Stroke Mother   . Diabetes Maternal Grandmother     Social History Social History   Tobacco Use  . Smoking status: Former Smoker    Types: Cigars  . Smokeless tobacco: Never Used  Substance Use Topics  . Alcohol use: Yes    Comment: none since pregnancy  . Drug use: No     Allergies   Flagyl [metronidazole] and Ciprofloxacin   Review of Systems Review of Systems  Constitutional: Negative for chills and fever.  Musculoskeletal:       Left finger pain  Skin: Positive for wound.  Neurological: Negative for weakness and numbness.   Physical Exam Updated Vital Signs BP 113/89 (  BP Location: Right Arm)   Pulse 86   Temp 98 F (36.7 C) (Oral)   Resp 16   Wt 48.1 kg   LMP 09/11/2017 (Exact Date)   SpO2 100%   Breastfeeding? No   BMI 19.39 kg/m   Physical Exam  Constitutional: She appears well-developed and well-nourished. No distress.  HENT:  Head: Normocephalic and atraumatic.  Eyes: Conjunctivae are normal.  Neck: Neck supple.  Cardiovascular: Normal rate.  Pulmonary/Chest: Effort normal.  Musculoskeletal:  Lesion to left finger that is mildly TTP. Appears somewhat similar to cherry angioma. No surrounding erythema. No obvious drainage from the wound.  Neurological: She is alert.  Skin: Skin is warm and dry. Capillary refill takes less than 2 seconds.  Psychiatric: She has a normal mood and affect.  Nursing note and vitals reviewed.  ED Treatments / Results  Labs (all labs ordered are listed, but only abnormal results are displayed) Labs Reviewed - No data to display  EKG None  Radiology No results found.  Procedures Procedures  (including critical care time)  Medications Ordered in ED Medications - No data to display   Initial Impression / Assessment and Plan / ED Course  I have reviewed the triage vital signs and the nursing notes.  Pertinent labs & imaging results that were available during my care of the patient were reviewed by me and considered in my medical decision making (see chart for details).     Final Clinical Impressions(s) / ED Diagnoses   Final diagnoses:  Finger lesion   Pt presents to the ED lesion to the left index ear that has been present for greater than 1 month.  Reports a distant injury.  Has had negative x-rays in the past.  Area has small area of erythema consistent with a possible cherry angioma.  Patient reported recent purulent drainage from the wound.  No systemic complaints of infection.  Will cover her for a potential infection.  Advised on wound care.  Gave information to follow-up with rheumatology.  Also gave information to follow-up with PCP.  Discussed that she may need to have this area biopsied since it has been present so persistently.  Strict return precautions discussed for any new or worsening symptoms.  Patient voiced an understanding of plan reasons to return to the ED.  All questions answered.  ED Discharge Orders         Ordered    doxycycline (VIBRAMYCIN) 100 MG capsule  2 times daily     10/01/17 1337           Tanish Prien S, PA-C 10/01/17 1342    Tilden Fossa, MD 10/03/17 1710

## 2017-10-01 NOTE — ED Notes (Signed)
Bed: WTR9 Expected date:  Expected time:  Means of arrival:  Comments: 

## 2018-12-31 ENCOUNTER — Other Ambulatory Visit: Payer: Self-pay

## 2018-12-31 DIAGNOSIS — Z20822 Contact with and (suspected) exposure to covid-19: Secondary | ICD-10-CM

## 2019-01-01 LAB — NOVEL CORONAVIRUS, NAA: SARS-CoV-2, NAA: DETECTED — AB

## 2020-05-18 IMAGING — CR DG FINGER INDEX 2+V*L*
3 series · 3 of 3 positions shown · non-contrast
Comparison: None.

CLINICAL DATA: Left index finger wound after injury.

EXAM:
LEFT INDEX FINGER 2+V

[x finger pa left]
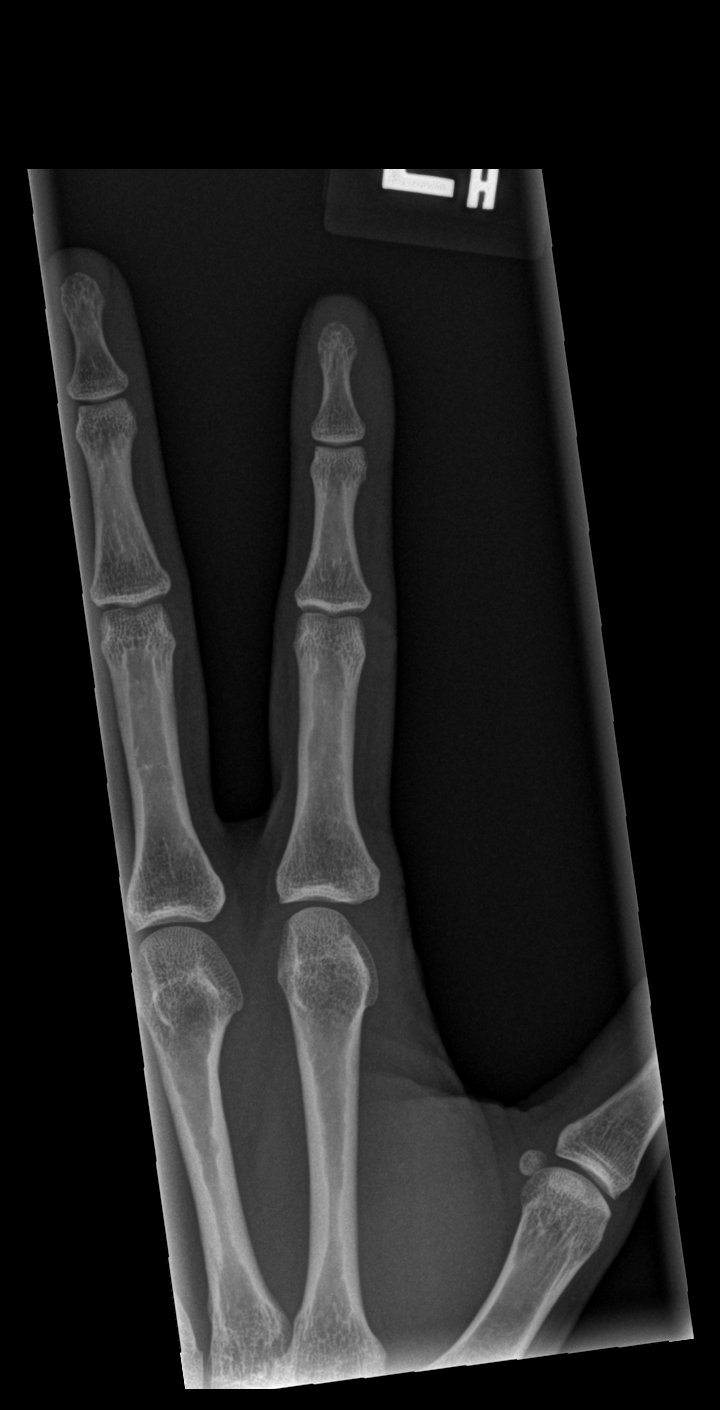

[x finger obl left]
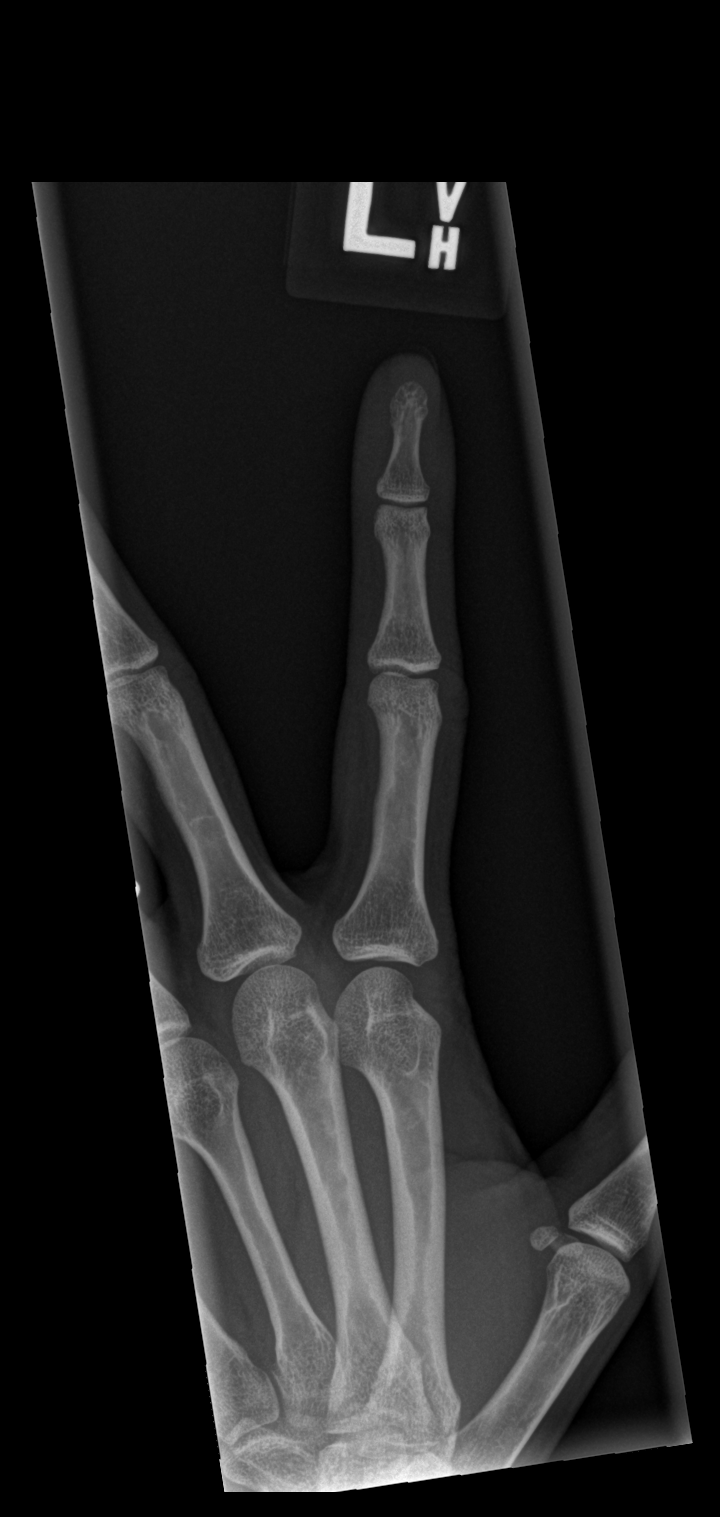

[x finger lat left]
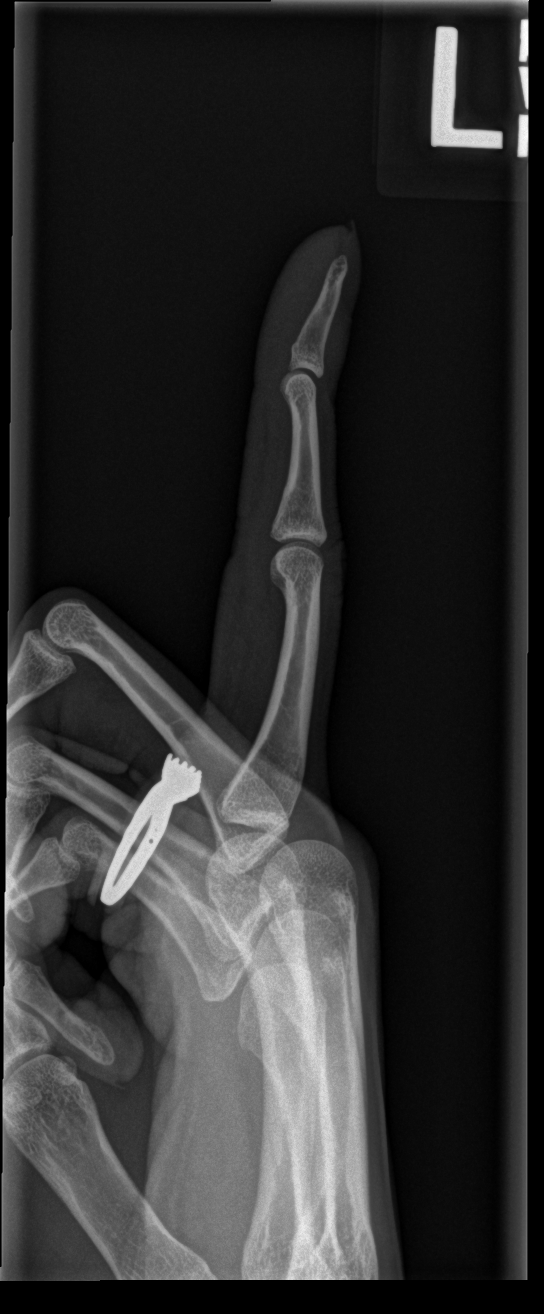

[3 of 3 positions shown; findings below may reference images not displayed]

FINDINGS: There is no evidence of fracture or dislocation. There is no
evidence of arthropathy or other focal bone abnormality. Soft
tissues are unremarkable.
IMPRESSION: Normal left index finger.

## 2020-10-22 ENCOUNTER — Encounter (HOSPITAL_COMMUNITY): Payer: Self-pay

## 2020-10-22 ENCOUNTER — Emergency Department (HOSPITAL_COMMUNITY)
Admission: EM | Admit: 2020-10-22 | Discharge: 2020-10-22 | Disposition: A | Discharge status: Home / Self Care | Payer: BC Managed Care – PPO | Attending: Emergency Medicine | Admitting: Emergency Medicine

## 2020-10-22 ENCOUNTER — Emergency Department (HOSPITAL_COMMUNITY): Payer: BC Managed Care – PPO

## 2020-10-22 ENCOUNTER — Other Ambulatory Visit: Payer: Self-pay

## 2020-10-22 DIAGNOSIS — R059 Cough, unspecified: Secondary | ICD-10-CM | POA: Diagnosis present

## 2020-10-22 DIAGNOSIS — Z20822 Contact with and (suspected) exposure to covid-19: Secondary | ICD-10-CM | POA: Diagnosis not present

## 2020-10-22 DIAGNOSIS — R5383 Other fatigue: Secondary | ICD-10-CM | POA: Diagnosis not present

## 2020-10-22 DIAGNOSIS — N9489 Other specified conditions associated with female genital organs and menstrual cycle: Secondary | ICD-10-CM | POA: Diagnosis not present

## 2020-10-22 DIAGNOSIS — R42 Dizziness and giddiness: Secondary | ICD-10-CM | POA: Diagnosis not present

## 2020-10-22 DIAGNOSIS — Z87891 Personal history of nicotine dependence: Secondary | ICD-10-CM | POA: Diagnosis not present

## 2020-10-22 LAB — CBC WITH DIFFERENTIAL/PLATELET
Abs Immature Granulocytes: 0.01 10*3/uL (ref 0.00–0.07)
Basophils Absolute: 0 10*3/uL (ref 0.0–0.1)
Basophils Relative: 1 %
Eosinophils Absolute: 0.1 10*3/uL (ref 0.0–0.5)
Eosinophils Relative: 1 %
HCT: 34 % — ABNORMAL LOW (ref 36.0–46.0)
Hemoglobin: 11.4 g/dL — ABNORMAL LOW (ref 12.0–15.0)
Immature Granulocytes: 0 %
Lymphocytes Relative: 40 %
Lymphs Abs: 2.3 10*3/uL (ref 0.7–4.0)
MCH: 27 pg (ref 26.0–34.0)
MCHC: 33.5 g/dL (ref 30.0–36.0)
MCV: 80.6 fL (ref 80.0–100.0)
Monocytes Absolute: 0.4 10*3/uL (ref 0.1–1.0)
Monocytes Relative: 7 %
Neutro Abs: 3 10*3/uL (ref 1.7–7.7)
Neutrophils Relative %: 51 %
Platelets: 308 10*3/uL (ref 150–400)
RBC: 4.22 MIL/uL (ref 3.87–5.11)
RDW: 14.2 % (ref 11.5–15.5)
WBC: 5.8 10*3/uL (ref 4.0–10.5)
nRBC: 0 % (ref 0.0–0.2)

## 2020-10-22 LAB — BASIC METABOLIC PANEL
Anion gap: 6 (ref 5–15)
BUN: 10 mg/dL (ref 6–20)
CO2: 25 mmol/L (ref 22–32)
Calcium: 9 mg/dL (ref 8.9–10.3)
Chloride: 109 mmol/L (ref 98–111)
Creatinine, Ser: 0.82 mg/dL (ref 0.44–1.00)
GFR, Estimated: >60 mL/min (ref >60)
Glucose, Bld: 102 mg/dL — ABNORMAL HIGH (ref 70–99)
Potassium: 3.5 mmol/L (ref 3.5–5.1)
Sodium: 140 mmol/L (ref 135–145)

## 2020-10-22 LAB — RESP PANEL BY RT-PCR (FLU A&B, COVID) ARPGX2
Influenza A by PCR: NEGATIVE
Influenza B by PCR: NEGATIVE
SARS Coronavirus 2 by RT PCR: NEGATIVE

## 2020-10-22 LAB — I-STAT BETA HCG BLOOD, ED (MC, WL, AP ONLY): I-stat hCG, quantitative: <5 m[IU]/mL (ref <5)

## 2020-10-22 LAB — TSH: TSH: 2.042 u[IU]/mL (ref 0.350–4.500)

## 2020-10-22 MED ORDER — FAMOTIDINE 20 MG PO TABS
20.0000 mg | ORAL_TABLET | Freq: Once | ORAL | Status: AC
  Administered 2020-10-22: 20 mg via ORAL
  Filled 2020-10-22: qty 1

## 2020-10-22 MED ORDER — BENZONATATE 100 MG PO CAPS: 100.0000 mg | ORAL_CAPSULE | Freq: Three times a day (TID) | ORAL | 0 refills | Status: DC | PRN

## 2020-10-22 MED ORDER — PANTOPRAZOLE SODIUM 40 MG PO TBEC: 40.0000 mg | DELAYED_RELEASE_TABLET | Freq: Every day | ORAL | 0 refills | Status: DC

## 2020-10-22 NOTE — ED Provider Notes (Signed)
[facility]-EMERGENCY DEPT Provider Note   CSN: [id number] Arrival date & time: 10/22/20  1713     History Chief Complaint  Patient presents with   Fatigue   Cough   Dizziness    Brianna Ali is a 35 y.o. female history of anemia, here presenting with fatigue, cough, dizziness.  Patient states that she has been having cough worse at night for the last month or so.  For the last week she feels lightheaded and dizzy. She also just feels very tired despite sleeping a lot.  Denies any fevers.  Denies any weight loss  The history is provided by the patient.      Past Medical History:  Diagnosis Date   Anemia    BV (bacterial vaginosis)     Patient Active Problem List   Diagnosis Date Noted   Normal labor 02/10/2017    Past Surgical History:  Procedure Laterality Date   NO PAST SURGERIES       OB History     Gravida  2   Para  2   Term  2   Preterm      AB      Living  2      SAB      IAB      Ectopic      Multiple  0   Live Births  1           Family History  Problem Relation Age of Onset   Hypertension Mother    Stroke Mother    Diabetes Maternal Grandmother     Social History   Tobacco Use   Smoking status: Former    Types: Cigars   Smokeless tobacco: Never  Vaping Use   Vaping Use: Never used  Substance Use Topics   Alcohol use: Yes   Drug use: No    Home Medications Prior to Admission medications   Medication Sig Start Date End Date Taking? Authorizing Provider  ferrous sulfate 325 (65 FE) MG tablet Take 325 mg by mouth daily with breakfast.    [provider]  ibuprofen (ADVIL,MOTRIN) 600 MG tablet Take 1 tablet (600 mg total) by mouth every 6 (six) hours. 02/13/17   Clemmons, Elmore Guise, CNM  Prenatal Vit-Fe Fumarate-FA (PRENATAL MULTIVITAMIN) TABS tablet Take 1 tablet by mouth daily at 12 noon.    [provider]    Allergies    Flagyl [metronidazole] and Ciprofloxacin  Review of  Systems   Review of Systems  Respiratory:  Positive for cough.   Neurological:  Positive for dizziness.  All other systems reviewed and are negative.  Physical Exam Updated Vital Signs BP 113/88   Pulse 74   Temp 98.7 F (37.1 C) (Oral)   Resp 16   Ht 5\' 2"  (1.575 m)   Wt 50.3 kg   LMP 09/20/2020   SpO2 100%   BMI 20.27 kg/m   Physical Exam Vitals and nursing note reviewed.  Constitutional:      Appearance: Normal appearance.  HENT:     Head: Normocephalic.     Nose: Nose normal.     Mouth/Throat:     Mouth: Mucous membranes are moist.  Eyes:     Extraocular Movements: Extraocular movements intact.     Pupils: Pupils are equal, round, and reactive to light.  Cardiovascular:     Rate and Rhythm: Normal rate and regular rhythm.     Pulses: Normal pulses.     Heart  sounds: Normal heart sounds.  Pulmonary:     Effort: Pulmonary effort is normal.     Breath sounds: Normal breath sounds.  Abdominal:     General: Abdomen is flat.     Palpations: Abdomen is soft.  Musculoskeletal:        General: Normal range of motion.     Cervical back: Normal range of motion and neck supple.  Skin:    General: Skin is warm.     Capillary Refill: Capillary refill takes less than 2 seconds.  Neurological:     General: No focal deficit present.     Mental Status: She is alert and oriented to person, place, and time.  Psychiatric:        Mood and Affect: Mood normal.        Behavior: Behavior normal.    ED Results / Procedures / Treatments   Labs (all labs ordered are listed, but only abnormal results are displayed) Labs Reviewed  BASIC METABOLIC PANEL - Abnormal; Notable for the following components:      Result Value   Glucose, Bld 102 (*)    All other components within normal limits  CBC WITH DIFFERENTIAL/PLATELET - Abnormal; Notable for the following components:   Hemoglobin 11.4 (*)    HCT 34.0 (*)    All other components within normal limits  RESP PANEL BY RT-PCR (FLU  A&B, COVID) ARPGX2  TSH  I-STAT BETA HCG BLOOD, ED ([facility], [facility], [facility] ONLY)    EKG None  Radiology DG Chest 2 View  Result Date: 10/22/2020 CLINICAL DATA:  Cough and fatigue for 1 month EXAM: CHEST - 2 VIEW COMPARISON:  None. FINDINGS: The heart size and mediastinal contours are within normal limits. Both lungs are clear. The visualized skeletal structures are unremarkable. IMPRESSION: No active cardiopulmonary disease. Electronically Signed   By: Sharlet Salina M.D.   On: 10/22/2020 18:44    Procedures Procedures   Medications Ordered in ED Medications  famotidine (PEPCID) tablet 20 mg (has no administration in time range)    ED Course  I have reviewed the triage vital signs and the nursing notes.  Pertinent labs & imaging results that were available during my care of the patient were reviewed by me and considered in my medical decision making (see chart for details).    MDM Rules/Calculators/A&P                           TREZURE CRONK is a 35 y.o. female here presenting with cough at night and fatigue.  Patient is well-appearing and afebrile.  Chest x-ray clear.  Labs unremarkable.  COVID test sent she has no fever or shortness of breath to suggest COVID.  Also added on TSH as well.  I think at this point, she likely has reflux.  Will start on PPIs and will give cough medicine.  I think she should follow-up with primary care doctor to evaluate for her fatigue   Final Clinical Impression(s) / ED Diagnoses Final diagnoses:  None    Rx / DC Orders ED Discharge Orders     None        Charlynne Pander, MD 10/22/20 2051

## 2020-10-22 NOTE — Discharge Instructions (Addendum)
Take Protonix daily.  Take Tessalon Perles for cough.  We sent off thyroid function test and COVID test.  You will be able to see there on your phone.  Follow-up with a GI doctor if you have persistent symptoms and you need to call your insurance company to find a primary care doctor in your network  Return to ER if you have worse cough, fatigue, dizziness, fever

## 2020-10-22 NOTE — ED Provider Notes (Signed)
Emergency Medicine Provider Triage Evaluation Note  Brianna Ali , a 35 y.o. female  was evaluated in triage.  Pt complains of cough for the past "month" worse over the past week - dry; worse at nighttime. Also complains of fatigue for the past week and increased sleepiness despite getting 7 hours of sleep a night. No other specific symptoms. LNMP 08/27. Sexually active.  Review of Systems  Positive: + cough, fatigue Negative: - chest pain, SOB, abdominal pain, nausea, vomiting, diarrhea, urinary symptoms, headache  Physical Exam  BP 104/81 (BP Location: Right Arm)   Pulse 85   Temp 98.7 F (37.1 C) (Oral)   Resp 16   Ht 5\' 2"  (1.575 m)   Wt 50.3 kg   LMP 09/20/2020   SpO2 100%   BMI 20.27 kg/m  Gen:   Awake, no distress   Resp:  Normal effort  MSK:   Moves extremities without difficulty  Other:    Medical Decision Making  Medically screening exam initiated at 5:47 PM.  Appropriate orders placed.  WESTLYN GLAZA was informed that the remainder of the evaluation will be completed by another provider, this initial triage assessment does not replace that evaluation, and the importance of remaining in the ED until their evaluation is complete.     Charlotta Newton, PA-C 10/22/20 1749    10/24/20, DO 10/22/20 2352

## 2020-10-22 NOTE — ED Triage Notes (Signed)
Patient c/o productive cough with clear sputum, fatigue, and lightheadedness a 1 week.

## 2022-02-14 ENCOUNTER — Emergency Department (HOSPITAL_COMMUNITY): Payer: No Typology Code available for payment source

## 2022-02-14 ENCOUNTER — Emergency Department (HOSPITAL_COMMUNITY)
Admission: EM | Admit: 2022-02-14 | Discharge: 2022-02-15 | Disposition: A | Discharge status: Home / Self Care | Payer: No Typology Code available for payment source | Attending: Emergency Medicine | Admitting: Emergency Medicine

## 2022-02-14 DIAGNOSIS — R0789 Other chest pain: Secondary | ICD-10-CM | POA: Insufficient documentation

## 2022-02-14 LAB — BASIC METABOLIC PANEL
Anion gap: 5 (ref 5–15)
BUN: 7 mg/dL (ref 6–20)
CO2: 25 mmol/L (ref 22–32)
Calcium: 8.5 mg/dL — ABNORMAL LOW (ref 8.9–10.3)
Chloride: 104 mmol/L (ref 98–111)
Creatinine, Ser: 0.67 mg/dL (ref 0.44–1.00)
GFR, Estimated: >60 mL/min (ref >60)
Glucose, Bld: 97 mg/dL (ref 70–99)
Potassium: 3.3 mmol/L — ABNORMAL LOW (ref 3.5–5.1)
Sodium: 134 mmol/L — ABNORMAL LOW (ref 135–145)

## 2022-02-14 LAB — I-STAT BETA HCG BLOOD, ED (MC, WL, AP ONLY): I-stat hCG, quantitative: <5 m[IU]/mL (ref <5)

## 2022-02-14 LAB — CBC
HCT: 32.4 % — ABNORMAL LOW (ref 36.0–46.0)
Hemoglobin: 11 g/dL — ABNORMAL LOW (ref 12.0–15.0)
MCH: 26.9 pg (ref 26.0–34.0)
MCHC: 34 g/dL (ref 30.0–36.0)
MCV: 79.2 fL — ABNORMAL LOW (ref 80.0–100.0)
Platelets: 302 10*3/uL (ref 150–400)
RBC: 4.09 MIL/uL (ref 3.87–5.11)
RDW: 15.2 % (ref 11.5–15.5)
WBC: 6.3 10*3/uL (ref 4.0–10.5)
nRBC: 0 % (ref 0.0–0.2)

## 2022-02-14 LAB — TROPONIN I (HIGH SENSITIVITY)
Troponin I (High Sensitivity): <2 ng/L (ref <18)
Troponin I (High Sensitivity): <2 ng/L (ref <18)

## 2022-02-14 NOTE — ED Triage Notes (Addendum)
Patient BIB [facility] from work for evaluation of sudden chest tightness, shortness of breath, and tingling in the left arm that started at approximately 1450 and got progressively worse but has no started to improve. Patient is alert, oriented, and in no apparent distress at this time. Patient received 324mg  ASA while at work.

## 2022-02-14 NOTE — ED Provider Triage Note (Signed)
Emergency Medicine Provider Triage Evaluation Note  Brianna Ali , a 37 y.o. female  was evaluated in triage.  Pt complains of chest tightness onset prior to arrival.  Patient has associated shortness of breath, tingling in left arm that started at 2:50 PM, resolving.  Patient given 324 mg aspirin while at work. Denies recent stressors at work. Denies nausea, vomiting. Denies PMHx of MI, DM, HTN.   Review of Systems  Positive:  Negative:   Physical Exam  BP 108/82   Pulse 85   Temp 98.6 F (37 C)   Resp 18   SpO2 100%  Gen:   Awake, no distress   Resp:  Normal effort  MSK:   Moves extremities without difficulty  Other:  No chest wall TTP  Medical Decision Making  Medically screening exam initiated at 4:50 PM.  Appropriate orders placed.  Brianna Ali was informed that the remainder of the evaluation will be completed by another provider, this initial triage assessment does not replace that evaluation, and the importance of remaining in the ED until their evaluation is complete.  Work-up initiated.    Antonious Omahoney A, PA-C 02/14/22 1655

## 2022-02-15 NOTE — ED Provider Notes (Signed)
[place] EMERGENCY DEPARTMENT AT [facility] Provider Note   CSN: [id number] Arrival date & time: 02/14/22  1642     History  Chief Complaint  Patient presents with   Chest Pain    Brianna Ali is a 37 y.o. female.  Patient presents to the emergency department for evaluation of chest pain.  Patient reports that she was at work when she had sudden onset of a sharp pain in the left chest with some heaviness associated.  She felt hot and flushed when this occurred.  She was not exerting herself when pain began.  She did not notice any exertional worsening.  Patient reports slow improvement of the symptoms over time, not having pain currently.  She does not have any cardiac risk factors, no cardiac history.       Home Medications Prior to Admission medications   Medication Sig Start Date End Date Taking? Authorizing Provider  benzonatate (TESSALON) 100 MG capsule Take 1 capsule (100 mg total) by mouth 3 (three) times daily as needed for cough. 10/22/20   Charlynne Pander, MD  ferrous sulfate 325 (65 FE) MG tablet Take 325 mg by mouth daily with breakfast.    [provider]  ibuprofen (ADVIL,MOTRIN) 600 MG tablet Take 1 tablet (600 mg total) by mouth every 6 (six) hours. 02/13/17   Clemmons, Lori A, CNM  pantoprazole (PROTONIX) 40 MG tablet Take 1 tablet (40 mg total) by mouth daily. 10/22/20   Charlynne Pander, MD  Prenatal Vit-Fe Fumarate-FA (PRENATAL MULTIVITAMIN) TABS tablet Take 1 tablet by mouth daily at 12 noon.    [provider]      Allergies    Flagyl [metronidazole] and Ciprofloxacin    Review of Systems   Review of Systems  Physical Exam Updated Vital Signs BP 111/84 (BP Location: Right Arm)   Pulse 82   Temp 98.6 F (37 C)   Resp 16   SpO2 100%  Physical Exam Vitals and nursing note reviewed.  Constitutional:      General: She is not in acute distress.    Appearance: She is well-developed.  HENT:     Head: Normocephalic and  atraumatic.     Mouth/Throat:     Mouth: Mucous membranes are moist.  Eyes:     General: Vision grossly intact. Gaze aligned appropriately.     Extraocular Movements: Extraocular movements intact.     Conjunctiva/sclera: Conjunctivae normal.  Cardiovascular:     Rate and Rhythm: Normal rate and regular rhythm.     Pulses: Normal pulses.     Heart sounds: Normal heart sounds, S1 normal and S2 normal. No murmur heard.    No friction rub. No gallop.  Pulmonary:     Effort: Pulmonary effort is normal. No respiratory distress.     Breath sounds: Normal breath sounds.  Abdominal:     General: Bowel sounds are normal.     Palpations: Abdomen is soft.     Tenderness: There is no abdominal tenderness. There is no guarding or rebound.     Hernia: No hernia is present.  Musculoskeletal:        General: No swelling.     Cervical back: Full passive range of motion without pain, normal range of motion and neck supple. No spinous process tenderness or muscular tenderness. Normal range of motion.     Right lower leg: No edema.     Left lower leg: No edema.  Skin:    General: Skin  is warm and dry.     Capillary Refill: Capillary refill takes less than 2 seconds.     Findings: No ecchymosis, erythema, rash or wound.  Neurological:     General: No focal deficit present.     Mental Status: She is alert and oriented to person, place, and time.     GCS: GCS eye subscore is 4. GCS verbal subscore is 5. GCS motor subscore is 6.     Cranial Nerves: Cranial nerves 2-12 are intact.     Sensory: Sensation is intact.     Motor: Motor function is intact.     Coordination: Coordination is intact.  Psychiatric:        Attention and Perception: Attention normal.        Mood and Affect: Mood normal.        Speech: Speech normal.        Behavior: Behavior normal.     ED Results / Procedures / Treatments   Labs (all labs ordered are listed, but only abnormal results are displayed) Labs Reviewed  BASIC  METABOLIC PANEL - Abnormal; Notable for the following components:      Result Value   Sodium 134 (*)    Potassium 3.3 (*)    Calcium 8.5 (*)    All other components within normal limits  CBC - Abnormal; Notable for the following components:   Hemoglobin 11.0 (*)    HCT 32.4 (*)    MCV 79.2 (*)    All other components within normal limits  I-STAT BETA HCG BLOOD, ED ([facility], [facility], [facility] ONLY)  TROPONIN I (HIGH SENSITIVITY)  TROPONIN I (HIGH SENSITIVITY)    EKG EKG Interpretation  Date/Time:  Monday February 14 2022 16:31:14 EST Ventricular Rate:  84 PR Interval:  148 QRS Duration: 70 QT Interval:  350 QTC Calculation: 413 R Axis:   63 Text Interpretation: Normal sinus rhythm Cannot rule out Anterior infarct , age undetermined Abnormal ECG No previous ECGs available Confirmed by Orpah Greek [phone]) on 02/14/2022 11:43:19 PM  Radiology DG Chest 2 View  Result Date: 02/14/2022 CLINICAL DATA:  Chest pain EXAM: CHEST - 2 VIEW COMPARISON:  10/22/2020 FINDINGS: The heart size and mediastinal contours are within normal limits. Both lungs are clear. The visualized skeletal structures are unremarkable. Air-fluid level along the stomach beneath the left hemidiaphragm. IMPRESSION: No acute cardiopulmonary disease Electronically Signed   By: Jill Side M.D.   On: 02/14/2022 18:00    Procedures Procedures    Medications Ordered in ED Medications - No data to display  ED Course/ Medical Decision Making/ A&P             HEART Score: 0                Medical Decision Making Amount and/or Complexity of Data Reviewed Labs: ordered. Radiology: ordered.   Presents to the emergency department for chest pain.  Pain seems somewhat atypical in nature, came on with maximal intensity while sitting at rest.  Pain has eased off without intervention other than aspirin.  She does not have any cardiac risk factors.  Troponins have been negative x 2.  Symptoms do not seem to be cardiac in origin,  does not require any significant follow-up unless symptoms continue.  Patient without respiratory symptoms.  No tachycardia, no hormone use.  PERC negative.        Final Clinical Impression(s) / ED Diagnoses Final diagnoses:  Atypical chest pain    Rx /  DC Orders ED Discharge Orders     None         Castulo Scarpelli, Gwenyth Allegra, MD 02/15/22 [phone]

## 2023-01-25 NOTE — L&D Delivery Note (Signed)
 OB/GYN Faculty Practice Delivery Note  Brianna Ali is a 38 y.o. [id number] s/p PTD at [redacted]w[redacted]d. She was being seen urgently in MAU given severe intermittent pelvic pain and significant vaginal bleeding.   ROM: 0h 21m with clear fluid GBS Status: unknown   Maximum Maternal Temperature: 98.52F  Labor Progress: Initial SVE: Bulging membranes at external os followed shortly by SROM. She then progressed to complete.   Delivery Date/Time: 2258 5/3 Delivery: She was being seen urgently in MAU given severe intermittent pelvic pain and significant vaginal bleeding.  BSUS w/ concern for dilated cervix and troubles verifying fetal heart activity, concerning for active miscarriage.  Pt verbally consented to speculum exam. Bulging membranes at external os followed shortly by SROM and delivery of this pre-viable female infant into the mouth of the speculum.  Great care taken to remove speculum without clamping down on fetus.  Fetus with extremity movements and palpated heart beat for roughly of life prior to passing.  Cord was clamped at approximately of life and cut to swaddle in warm baby blanket to keep the infant as comfortable as it could be.  Mom and dad initially did not want to see the baby but later had baby swaddled and placed on mom's chest after it's passing.  Placenta delivered shortly after Buccal Cytotec given with significant improvement in both pain and vaginal bleeding following delivery of placenta.  Pt offered anora testing and they accepted.  Baby Weight: pending  Placenta: 3 vessel, intact. Sent to pathology Complications: PTD w/ fetal demise Lacerations: none EBL: ~200 mL Analgesia: IV fentanyl     Infant:  APGAR (1 MIN): 2  APGAR (5 MINS): 2   Ebony Goldstein, MD [facility] Family Medicine Fellow, [facility] for [facility], [facility] 05/28/2023, 2:30 AM

## 2023-02-14 ENCOUNTER — Encounter (HOSPITAL_COMMUNITY): Payer: Self-pay

## 2023-02-14 ENCOUNTER — Emergency Department (HOSPITAL_COMMUNITY)
Admission: EM | Admit: 2023-02-14 | Discharge: 2023-02-14 | Disposition: A | Discharge status: Home / Self Care | Payer: Self-pay | Attending: Emergency Medicine | Admitting: Emergency Medicine

## 2023-02-14 ENCOUNTER — Other Ambulatory Visit: Payer: Self-pay

## 2023-02-14 ENCOUNTER — Emergency Department (HOSPITAL_COMMUNITY): Payer: Self-pay

## 2023-02-14 DIAGNOSIS — Z20822 Contact with and (suspected) exposure to covid-19: Secondary | ICD-10-CM | POA: Diagnosis not present

## 2023-02-14 DIAGNOSIS — J101 Influenza due to other identified influenza virus with other respiratory manifestations: Secondary | ICD-10-CM | POA: Diagnosis not present

## 2023-02-14 DIAGNOSIS — R509 Fever, unspecified: Secondary | ICD-10-CM | POA: Diagnosis present

## 2023-02-14 LAB — RESP PANEL BY RT-PCR (RSV, FLU A&B, COVID)  RVPGX2
Influenza A by PCR: POSITIVE — AB
Influenza B by PCR: NEGATIVE
Resp Syncytial Virus by PCR: NEGATIVE
SARS Coronavirus 2 by RT PCR: NEGATIVE

## 2023-02-14 MED ORDER — ACETAMINOPHEN 325 MG PO TABS
650.0000 mg | ORAL_TABLET | Freq: Once | ORAL | Status: AC | PRN
  Administered 2023-02-14: 650 mg via ORAL
  Filled 2023-02-14: qty 2

## 2023-02-14 NOTE — Discharge Instructions (Signed)
You tested positive for influenza A. Please review attached information. I recommend taking Tylenol regularly for fever.   Please seek medical attention if you have worsening symptoms including chest pain, shortness of breath, or persistent symptoms for greater than 10 days

## 2023-02-14 NOTE — ED Provider Notes (Signed)
[place] EMERGENCY DEPARTMENT AT [facility] Provider Note   CSN: [id number] Arrival date & time: 02/14/23  1305     History  Chief Complaint  Patient presents with   Fever    Brianna Ali is a 38 y.o. female.  38 year old otherwise healthy female presenting for 1 day history of fever with headache, body aches, nonproductive cough, and sore throat.  Denies any nausea, vomiting, abdominal pain, chest pain, shortness of breath, dysuria.  No known sick contacts.  Negative COVID test at home.  States she has been taking ibuprofen every 4-6 hours without much relief.  Her fever has persisted after ibuprofen use which is why she presented to the ED. Has been tolerating fluids today.   Fever Associated symptoms: cough, headaches, myalgias and sore throat   Associated symptoms: no chest pain, no diarrhea, no dysuria, no ear pain, no nausea, no rash and no vomiting        Home Medications Prior to Admission medications   Medication Sig Start Date End Date Taking? Authorizing Provider  benzonatate (TESSALON) 100 MG capsule Take 1 capsule (100 mg total) by mouth 3 (three) times daily as needed for cough. 10/22/20   Charlynne Pander, MD  ferrous sulfate 325 (65 FE) MG tablet Take 325 mg by mouth daily with breakfast.    [provider]  ibuprofen (ADVIL,MOTRIN) 600 MG tablet Take 1 tablet (600 mg total) by mouth every 6 (six) hours. 02/13/17   Clemmons, Lori A, CNM  pantoprazole (PROTONIX) 40 MG tablet Take 1 tablet (40 mg total) by mouth daily. 10/22/20   Charlynne Pander, MD  Prenatal Vit-Fe Fumarate-FA (PRENATAL MULTIVITAMIN) TABS tablet Take 1 tablet by mouth daily at 12 noon.    [provider]      Allergies    Ciprofloxacin and Metronidazole    Review of Systems   Review of Systems  Constitutional:  Positive for fever.  HENT:  Positive for postnasal drip and sore throat. Negative for ear pain.   Respiratory:  Positive for cough. Negative for  shortness of breath and wheezing.   Cardiovascular:  Negative for chest pain.  Gastrointestinal:  Negative for abdominal pain, diarrhea, nausea and vomiting.  Genitourinary:  Negative for dysuria and hematuria.  Musculoskeletal:  Positive for myalgias.  Skin:  Negative for rash and wound.  Neurological:  Positive for headaches. Negative for dizziness, weakness and numbness.    Physical Exam Updated Vital Signs BP 115/84 (BP Location: Left Arm)   Pulse (!) 110   Temp (!) 103.1 F (39.5 C) (Oral)   Resp 18   Ht 5\' 2"  (1.575 m)   Wt 50 kg   SpO2 100%   BMI 20.16 kg/m  Physical Exam Constitutional:      General: She is not in acute distress.    Appearance: She is not toxic-appearing.  HENT:     Head: Normocephalic and atraumatic.     Mouth/Throat:     Pharynx: Posterior oropharyngeal erythema and postnasal drip present.  Eyes:     Extraocular Movements: Extraocular movements intact.     Conjunctiva/sclera: Conjunctivae normal.     Pupils: Pupils are equal, round, and reactive to light.  Cardiovascular:     Rate and Rhythm: Normal rate and regular rhythm.     Heart sounds: Normal heart sounds. No murmur heard. Pulmonary:     Effort: No respiratory distress.     Breath sounds: Normal breath sounds. No wheezing, rhonchi or rales.  Abdominal:  General: Abdomen is flat. There is no distension.     Palpations: Abdomen is soft.     Tenderness: There is no abdominal tenderness.  Musculoskeletal:        General: No swelling or deformity.     Cervical back: Neck supple. No rigidity or tenderness.     Right lower leg: No edema.     Left lower leg: No edema.  Skin:    General: Skin is warm and dry.  Neurological:     General: No focal deficit present.     Mental Status: She is alert. Mental status is at baseline.     ED Results / Procedures / Treatments   Labs (all labs ordered are listed, but only abnormal results are displayed) Labs Reviewed  RESP PANEL BY RT-PCR  (RSV, FLU A&B, COVID)  [id number]    EKG None  Radiology No results found.  Procedures Procedures    Medications Ordered in ED Medications  acetaminophen (TYLENOL) tablet 650 mg (650 mg Oral Given 02/14/23 1330)    ED Course/ Medical Decision Making/ A&P                                 Medical Decision Making 38 year old otherwise healthy female presenting due to 1 day history of fever responding to ibuprofen at home.  Also with headache, body aches, postnasal drip, sore throat.  Exam is overall reassuring and points towards likely viral URI as the cause of her symptoms.  3:30 PM Temp improved to 99.4 after oral Tylenol. CXR negative. Positive for Flu A. Discussed supportive care with OTC tylenol/ibuprofen and return precautions, pt stable for discharge  Risk OTC drugs.           Final Clinical Impression(s) / ED Diagnoses Final diagnoses:  None    Rx / DC Orders ED Discharge Orders     None         Vonna Drafts, MD 02/14/23 1534    Lorre Nick, MD 02/15/23 1217

## 2023-02-14 NOTE — ED Triage Notes (Signed)
C/o chills, headache, cough, sore throat, body aches, fever with t.max 103 at home x1 day.   Home covid test negative.

## 2023-02-14 NOTE — ED Provider Notes (Signed)
I saw and evaluated the patient, reviewed the resident's note and I agree with the findings and plan.   Patient presents with URI symptoms x 1 day.  Chest x-ray is negative.  Flu test is positive.  Will treat symptomatically       Lorre Nick, MD 02/14/23 1528

## 2023-04-05 DIAGNOSIS — Z419 Encounter for procedure for purposes other than remedying health state, unspecified: Secondary | ICD-10-CM | POA: Diagnosis not present

## 2023-05-06 DIAGNOSIS — Z419 Encounter for procedure for purposes other than remedying health state, unspecified: Secondary | ICD-10-CM | POA: Diagnosis not present

## 2023-05-16 ENCOUNTER — Emergency Department (HOSPITAL_COMMUNITY)
Admission: EM | Admit: 2023-05-16 | Discharge: 2023-05-16 | Disposition: A | Discharge status: Home / Self Care | Attending: Emergency Medicine | Admitting: Emergency Medicine

## 2023-05-16 ENCOUNTER — Emergency Department (HOSPITAL_COMMUNITY)

## 2023-05-16 DIAGNOSIS — Z3A13 13 weeks gestation of pregnancy: Secondary | ICD-10-CM | POA: Insufficient documentation

## 2023-05-16 DIAGNOSIS — O09511 Supervision of elderly primigravida, first trimester: Secondary | ICD-10-CM | POA: Diagnosis not present

## 2023-05-16 DIAGNOSIS — R1032 Left lower quadrant pain: Secondary | ICD-10-CM | POA: Insufficient documentation

## 2023-05-16 DIAGNOSIS — O26891 Other specified pregnancy related conditions, first trimester: Secondary | ICD-10-CM | POA: Insufficient documentation

## 2023-05-16 DIAGNOSIS — R1030 Lower abdominal pain, unspecified: Secondary | ICD-10-CM | POA: Diagnosis not present

## 2023-05-16 DIAGNOSIS — O26892 Other specified pregnancy related conditions, second trimester: Secondary | ICD-10-CM

## 2023-05-16 DIAGNOSIS — Z3A01 Less than 8 weeks gestation of pregnancy: Secondary | ICD-10-CM | POA: Diagnosis not present

## 2023-05-16 LAB — COMPREHENSIVE METABOLIC PANEL WITH GFR
ALT: 7 U/L (ref 0–44)
AST: 12 U/L — ABNORMAL LOW (ref 15–41)
Albumin: 3.1 g/dL — ABNORMAL LOW (ref 3.5–5.0)
Alkaline Phosphatase: 43 U/L (ref 38–126)
Anion gap: 8 (ref 5–15)
BUN: 9 mg/dL (ref 6–20)
CO2: 22 mmol/L (ref 22–32)
Calcium: 8.7 mg/dL — ABNORMAL LOW (ref 8.9–10.3)
Chloride: 103 mmol/L (ref 98–111)
Creatinine, Ser: 0.54 mg/dL (ref 0.44–1.00)
GFR, Estimated: >60 mL/min (ref >60)
Glucose, Bld: 81 mg/dL (ref 70–99)
Potassium: 3.7 mmol/L (ref 3.5–5.1)
Sodium: 133 mmol/L — ABNORMAL LOW (ref 135–145)
Total Bilirubin: 0.3 mg/dL (ref 0.0–1.2)
Total Protein: 6.7 g/dL (ref 6.5–8.1)

## 2023-05-16 LAB — HCG, QUANTITATIVE, PREGNANCY: hCG, Beta Chain, Quant, S: 113670 m[IU]/mL — ABNORMAL HIGH (ref <5)

## 2023-05-16 LAB — URINALYSIS, W/ REFLEX TO CULTURE (INFECTION SUSPECTED)
Bacteria, UA: NONE SEEN
Bilirubin Urine: NEGATIVE
Glucose, UA: NEGATIVE mg/dL
Hgb urine dipstick: NEGATIVE
Ketones, ur: NEGATIVE mg/dL
Nitrite: NEGATIVE
Protein, ur: NEGATIVE mg/dL
Specific Gravity, Urine: 1.011 (ref 1.005–1.030)
pH: 7 (ref 5.0–8.0)

## 2023-05-16 LAB — CBC WITH DIFFERENTIAL/PLATELET
Abs Immature Granulocytes: 0.04 10*3/uL (ref 0.00–0.07)
Basophils Absolute: 0 10*3/uL (ref 0.0–0.1)
Basophils Relative: 0 %
Eosinophils Absolute: 0.1 10*3/uL (ref 0.0–0.5)
Eosinophils Relative: 1 %
HCT: 31.2 % — ABNORMAL LOW (ref 36.0–46.0)
Hemoglobin: 10.2 g/dL — ABNORMAL LOW (ref 12.0–15.0)
Immature Granulocytes: 0 %
Lymphocytes Relative: 22 %
Lymphs Abs: 1.9 10*3/uL (ref 0.7–4.0)
MCH: 26.5 pg (ref 26.0–34.0)
MCHC: 32.7 g/dL (ref 30.0–36.0)
MCV: 81 fL (ref 80.0–100.0)
Monocytes Absolute: 0.7 10*3/uL (ref 0.1–1.0)
Monocytes Relative: 8 %
Neutro Abs: 6.2 10*3/uL (ref 1.7–7.7)
Neutrophils Relative %: 69 %
Platelets: 262 10*3/uL (ref 150–400)
RBC: 3.85 MIL/uL — ABNORMAL LOW (ref 3.87–5.11)
RDW: 14.7 % (ref 11.5–15.5)
WBC: 9 10*3/uL (ref 4.0–10.5)
nRBC: 0 % (ref 0.0–0.2)

## 2023-05-16 NOTE — ED Provider Notes (Signed)
 Brianna Ali EMERGENCY DEPARTMENT AT [facility] Provider Note   CSN: [id number] Arrival date & time: 05/16/23  1227     History  No chief complaint on file.   Brianna Ali is a 38 y.o. female, history of anemia, who presents to the ED secondary to left lower quadrant abdominal pain that is been going on for several weeks.  She states it comes and goes, and can be sharp and stabbing, at times.  This is worse, when her bladder is full.  She states that she is not having any nausea or vomiting.  Denies any new vaginal discharge, or bleeding.  She states that she has not tried anything for the pain, notes that she is wants to make sure the baby is okay.  Denies any diarrhea, or severe back pain.  No IV drug use.  States that it became more constant today, but has now resolved, is only 1 out of 10 pain and slightly achy.    Home Medications Prior to Admission medications   Medication Sig Start Date End Date Taking? Authorizing Provider  benzonatate  (TESSALON ) 100 MG capsule Take 1 capsule (100 mg total) by mouth 3 (three) times daily as needed for cough. 10/22/20   Dalene Duck, MD  ferrous sulfate 325 (65 FE) MG tablet Take 325 mg by mouth daily with breakfast.    [provider]  ibuprofen  (ADVIL ,MOTRIN ) 600 MG tablet Take 1 tablet (600 mg total) by mouth every 6 (six) hours. 02/13/17   Clemmons, Casper Clement, CNM  pantoprazole  (PROTONIX ) 40 MG tablet Take 1 tablet (40 mg total) by mouth daily. 10/22/20   Dalene Duck, MD  Prenatal Vit-Fe Fumarate-FA (PRENATAL MULTIVITAMIN) TABS tablet Take 1 tablet by mouth daily at 12 noon.    [provider]      Allergies    Ciprofloxacin and Metronidazole     Review of Systems   Review of Systems  Constitutional:  Negative for fever.  Gastrointestinal:  Positive for abdominal pain.    Physical Exam Updated Vital Signs BP 102/70   Pulse 88   Temp 98.4 F (36.9 C) (Oral)   Resp (!) 76   SpO2 100%  Physical  Exam Vitals and nursing note reviewed.  Constitutional:      General: She is not in acute distress.    Appearance: She is well-developed.  HENT:     Head: Normocephalic and atraumatic.  Eyes:     Conjunctiva/sclera: Conjunctivae normal.  Cardiovascular:     Rate and Rhythm: Normal rate and regular rhythm.     Heart sounds: No murmur heard. Pulmonary:     Effort: Pulmonary effort is normal. No respiratory distress.     Breath sounds: Normal breath sounds.  Abdominal:     Palpations: Abdomen is soft.     Tenderness: There is abdominal tenderness in the left lower quadrant. There is no guarding.  Musculoskeletal:        General: No swelling.     Cervical back: Neck supple.  Skin:    General: Skin is warm and dry.     Capillary Refill: Capillary refill takes less than 2 seconds.  Neurological:     Mental Status: She is alert.  Psychiatric:        Mood and Affect: Mood normal.     ED Results / Procedures / Treatments   Labs (all labs ordered are listed, but only abnormal results are displayed) Labs Reviewed  HCG, QUANTITATIVE, PREGNANCY - Abnormal; Notable  for the following components:      Result Value   hCG, Beta Chain, Quant, S 113,670 (*)    All other components within normal limits  URINALYSIS, W/ REFLEX TO CULTURE (INFECTION SUSPECTED) - Abnormal; Notable for the following components:   Leukocytes,Ua LARGE (*)    All other components within normal limits  COMPREHENSIVE METABOLIC PANEL WITH GFR - Abnormal; Notable for the following components:   Sodium 133 (*)    Calcium 8.7 (*)    Albumin 3.1 (*)    AST 12 (*)    All other components within normal limits  CBC WITH DIFFERENTIAL/PLATELET - Abnormal; Notable for the following components:   RBC 3.85 (*)    Hemoglobin 10.2 (*)    HCT 31.2 (*)    All other components within normal limits    EKG None  Radiology US  OB Comp Less 14 Wks Result Date: 05/16/2023 CLINICAL DATA:  Lower abdominal pain. EXAM: OBSTETRIC  <14 WK ULTRASOUND TECHNIQUE: Transabdominal ultrasound was performed for evaluation of the gestation as well as the maternal uterus and adnexal regions. COMPARISON:  None Available. FINDINGS: Intrauterine gestational sac: Single intrauterine gestational sac. Yolk sac:  Not seen Embryo:  Present Cardiac Activity: Detected Heart Rate: 165 bpm CRL:   76 mm   13 w 4 d                  US  EDC: 11/17/2023 Subchorionic hemorrhage:  None visualized. Maternal uterus/adnexae: The maternal ovaries are unremarkable. IMPRESSION: Single live intrauterine pregnancy with an estimated gestational age of [redacted] weeks, 4 days. Electronically Signed   By: Angus Bark M.D.   On: 05/16/2023 16:23    Procedures Procedures    Medications Ordered in ED Medications - No data to display  ED Course/ Medical Decision Making/ A&P                                 Medical Decision Making Patient is a well-appearing 38 year old female, [redacted] weeks pregnant, here for lower abdominal pain, that began about 2 weeks ago, has become more constant.  She has mild tenderness to palpation of the left lower quadrant on exam without any guarding.  Denies any urinary symptoms.  Except for some increased pain, when bladder is full.  She has tenderness to palpation in the left groin, but no hernias palpated.  Will obtain transvaginal ultrasound to evaluate the fetus, and obtain labs and urinalysis.  Denies any increased vaginal discharge, or symptoms, thus we will hold off on testing at this time.  She states she would like to follow-up with her OB in regards to this.  Amount and/or Complexity of Data Reviewed Labs:     Details: Unremarkable Radiology:     Details: Ultrasound shows single living IUP, with estimated gestational age of [redacted] weeks 4 days Discussion of management or test interpretation with external provider(s): Ultrasound is unremarkable, blood work is reassuring, her pain is minimal.  Given that she is pregnant, I recommend  against a CAT scan at this time, she is overall well-appearing, this may be musculoskeletal pain, versus ligamentous pain, as she is a petite female, and that her some of her ligaments may be pulling secondary to the pregnancy.  Her ovaries look good, on the ultrasound, instructed to follow-up with her OB, and return if symptoms worsen.    Final Clinical Impression(s) / ED Diagnoses Final diagnoses:  Abdominal pain during pregnancy  in second trimester    Rx / DC Orders ED Discharge Orders     None         Timmy Forbes, [place] 05/16/23 1813    Mozell Arias, MD 05/16/23 2325

## 2023-05-16 NOTE — ED Triage Notes (Signed)
 Pt arrived reporting lower abdominal pain that began a few days ago, getting worse. Denies vaginal bleeding. Reports [redacted] weeks pregnant.

## 2023-05-16 NOTE — ED Provider Triage Note (Signed)
 Emergency Medicine Provider Triage Evaluation Note  Brianna Ali , a 38 y.o. female  was evaluated in triage.  Pt complains of abdominal pain. Patient has been have intermittent LLQ sharp pain x2 weeks. Pain became constant today. Radiates across abdomen.  [redacted] weeks pregnant. Has not seen OB yet.  Review of Systems  Positive: Abdominal pain Negative: Vaginal bleeding, dysuria, fever  Physical Exam  BP 100/74 (BP Location: Left Arm)   Pulse (!) 105   Temp 98.2 F (36.8 C) (Oral)   Resp 16   SpO2 100%  Gen:   Awake, no distress  Resp:  Normal effort  MSK:   Moves extremities without difficulty  Other:    Medical Decision Making  Medically screening exam initiated at 1:03 PM.  Appropriate orders placed.  TYRIANNA LIGHTLE was informed that the remainder of the evaluation will be completed by another provider, this initial triage assessment does not replace that evaluation, and the importance of remaining in the ED until their evaluation is complete.    Sonnie Dusky, PA-C 05/16/23 1304

## 2023-05-16 NOTE — Discharge Instructions (Addendum)
 The cause of your abdominal pain is unclear at this time, your blood work is overall reassuring, and your ultrasound shows no acute findings.  Your baby looks healthy, and good at this time.  Please follow-up with your OB for further evaluation, please apply heat, to the area, as it may be more musculoskeletal.  Return to the ER if you feel like your symptoms are worsening

## 2023-05-24 ENCOUNTER — Encounter (HOSPITAL_COMMUNITY): Payer: Self-pay | Admitting: *Deleted

## 2023-05-24 ENCOUNTER — Inpatient Hospital Stay (HOSPITAL_COMMUNITY)
Admission: AD | Admit: 2023-05-24 | Discharge: 2023-05-24 | Disposition: A | Discharge status: Home / Self Care | Attending: Obstetrics and Gynecology | Admitting: Obstetrics and Gynecology

## 2023-05-24 ENCOUNTER — Encounter: Payer: Self-pay | Admitting: Obstetrics and Gynecology

## 2023-05-24 DIAGNOSIS — Z3A14 14 weeks gestation of pregnancy: Secondary | ICD-10-CM | POA: Insufficient documentation

## 2023-05-24 DIAGNOSIS — O26899 Other specified pregnancy related conditions, unspecified trimester: Secondary | ICD-10-CM

## 2023-05-24 DIAGNOSIS — N898 Other specified noninflammatory disorders of vagina: Secondary | ICD-10-CM | POA: Insufficient documentation

## 2023-05-24 DIAGNOSIS — O26892 Other specified pregnancy related conditions, second trimester: Secondary | ICD-10-CM | POA: Diagnosis not present

## 2023-05-24 DIAGNOSIS — R102 Pelvic and perineal pain: Secondary | ICD-10-CM | POA: Insufficient documentation

## 2023-05-24 LAB — URINALYSIS, ROUTINE W REFLEX MICROSCOPIC
Bilirubin Urine: NEGATIVE
Glucose, UA: NEGATIVE mg/dL
Ketones, ur: NEGATIVE mg/dL
Nitrite: NEGATIVE
Protein, ur: NEGATIVE mg/dL
Specific Gravity, Urine: 1.011 (ref 1.005–1.030)
pH: 6 (ref 5.0–8.0)

## 2023-05-24 LAB — WET PREP, GENITAL
Clue Cells Wet Prep HPF POC: NONE SEEN
Sperm: NONE SEEN
Trich, Wet Prep: NONE SEEN
WBC, Wet Prep HPF POC: >=10 — AB (ref <10)
Yeast Wet Prep HPF POC: NONE SEEN

## 2023-05-24 NOTE — MAU Note (Signed)
.  Brianna Ali is a 38 y.o. at [redacted]w[redacted]d here in MAU reporting: has been having a yellow green vaginal discharge 2-3 weeks. Tdya is more dark red to brown. Also reports a lower abd pain that was only on her left side but is now all across her abd.   LMP:  Onset of complaint: 2-3 weeks Pain score: 4-5 There were no vitals filed for this visit.   FHT: 156   Lab orders placed from triage: u/a,

## 2023-05-24 NOTE — MAU Provider Note (Signed)
 History     CSN: [id number]  Arrival date and time: 05/24/23 1210   Event Date/Time   First Provider Initiated Contact with Patient 05/24/23 1336      Chief Complaint  Patient presents with   Vaginal Discharge   Pelvic Pain   HPI [name] is a 38 y.o. year old G12P2002 female at [redacted]w[redacted]d weeks gestation who presents to MAU reporting she had been having a yellowish-green vaginal discharge for 2-3 weeks. She reports that today the vaginal discharge was dark red to brown. She also reports LT lower abdominal cramping/pain, but now it's all across her abdomen. She rates the cramping/pain 4-5/10. She plans to receive [facility]; first appt is 05/29/2023. Her spouse is present and contributing to the history taking.     OB History     Gravida  3   Para  2   Term  2   Preterm      AB      Living  2      SAB      IAB      Ectopic      Multiple  0   Live Births  1           Past Medical History:  Diagnosis Date   Anemia    BV (bacterial vaginosis)     Past Surgical History:  Procedure Laterality Date   NO PAST SURGERIES      Family History  Problem Relation Age of Onset   Hypertension Mother    Stroke Mother    Diabetes Maternal Grandmother     Social History   Tobacco Use   Smoking status: Former    Types: Cigars   Smokeless tobacco: Never  Vaping Use   Vaping status: Never Used  Substance Use Topics   Alcohol use: Yes   Drug use: No    Allergies:  Allergies  Allergen Reactions   Ciprofloxacin Rash and Hives   Metronidazole  Hives    Medications Prior to Admission  Medication Sig Dispense Refill Last Dose/Taking   benzonatate  (TESSALON ) 100 MG capsule Take 1 capsule (100 mg total) by mouth 3 (three) times daily as needed for cough. 21 capsule 0    ferrous sulfate 325 (65 FE) MG tablet Take 325 mg by mouth daily with breakfast.      ibuprofen  (ADVIL ,MOTRIN ) 600 MG tablet Take 1 tablet (600 mg total) by mouth  every 6 (six) hours. 30 tablet 0    pantoprazole  (PROTONIX ) 40 MG tablet Take 1 tablet (40 mg total) by mouth daily. 30 tablet 0    Prenatal Vit-Fe Fumarate-FA (PRENATAL MULTIVITAMIN) TABS tablet Take 1 tablet by mouth daily at 12 noon.       Review of Systems  Constitutional: Negative.   HENT: Negative.    Eyes: Negative.   Respiratory: Negative.    Cardiovascular: Negative.   Gastrointestinal: Negative.   Endocrine: Negative.   Genitourinary:  Positive for pelvic pain and vaginal bleeding.  Musculoskeletal: Negative.   Skin: Negative.   Allergic/Immunologic: Negative.   Neurological: Negative.   Hematological: Negative.   Psychiatric/Behavioral: Negative.     Physical Exam   Blood pressure 95/69, pulse 96, temperature 98 F (36.7 C), height 5\' 1"  (1.549 m), weight 50.8 kg, last menstrual period 02/08/2023.  Physical Exam Vitals and nursing note reviewed.  Constitutional:      Appearance: Normal appearance. She is normal weight.  Cardiovascular:     Rate and Rhythm:  Normal rate.  Pulmonary:     Effort: Pulmonary effort is normal.  Genitourinary:    Comments: Swabs collected by patient using blind swab technique  Musculoskeletal:        General: Normal range of motion.  Skin:    General: Skin is warm and dry.  Neurological:     Mental Status: She is alert and oriented to person, place, and time.  Psychiatric:        Mood and Affect: Mood normal.        Behavior: Behavior normal.        Thought Content: Thought content normal.        Judgment: Judgment normal.   FHTs by doppler: 156 bpm   MAU Course  Procedures  MDM CCUA Wet Prep GC/CT -- Results pending   Results for orders placed or performed during the hospital encounter of 05/24/23 (from the past 24 hours)  Urinalysis, Routine w reflex microscopic -Urine, Clean Catch     Status: Abnormal   Collection Time: 05/24/23 12:31 PM  Result Value Ref Range   Color, Urine YELLOW YELLOW   APPearance HAZY (A)  CLEAR   Specific Gravity, Urine 1.011 1.005 - 1.030   pH 6.0 5.0 - 8.0   Glucose, UA NEGATIVE NEGATIVE mg/dL   Hgb urine dipstick MODERATE (A) NEGATIVE   Bilirubin Urine NEGATIVE NEGATIVE   Ketones, ur NEGATIVE NEGATIVE mg/dL   Protein, ur NEGATIVE NEGATIVE mg/dL   Nitrite NEGATIVE NEGATIVE   Leukocytes,Ua LARGE (A) NEGATIVE   RBC / HPF 6-10 0 - 5 RBC/hpf   WBC, UA 21-50 0 - 5 WBC/hpf   Bacteria, UA RARE (A) NONE SEEN   Squamous Epithelial / HPF 0-5 0 - 5 /HPF  Wet prep, genital     Status: Abnormal   Collection Time: 05/24/23 12:31 PM   Specimen: PATH Cytology Cervicovaginal Ancillary Only  Result Value Ref Range   Yeast Wet Prep HPF POC NONE SEEN NONE SEEN   Trich, Wet Prep NONE SEEN NONE SEEN   Clue Cells Wet Prep HPF POC NONE SEEN NONE SEEN   WBC, Wet Prep HPF POC >=10 (A) <10   Sperm NONE SEEN     Assessment and Plan  1. Vaginal discharge during pregnancy in second trimester (Primary) - Reassurance given that the vaginal discharge is not from infection - Return to MAU: If you have heavier bleeding that soaks through more that 2 pads per hour for an hour or more If you bleed so much that you feel like you might pass out or you do pass out If you have significant abdominal pain that is not improved with Tylenol  1000 mg every 8 hours as needed for pain If you develop a fever > 100.5  2. Pain of round ligament affecting pregnancy, antepartum - Information provided on RLP   3. [redacted] weeks gestation of pregnancy   - Discharge home - Keep scheduled appt with [facility] on 05/29/2023 - Patient verbalized an understanding of the plan of care and agrees.  Nicholl Onstott, CNM 05/24/2023, 1:36 PM

## 2023-05-24 NOTE — Discharge Instructions (Signed)
 Return to MAU: If you have heavier bleeding that soaks through more that 2 pads per hour for an hour or more If you bleed so much that you feel like you might pass out or you do pass out If you have significant abdominal pain that is not improved with Tylenol 1000 mg every 8 hours as needed for pain If you develop a fever > 100.5

## 2023-05-25 LAB — CULTURE, OB URINE: Culture: NO GROWTH

## 2023-05-25 LAB — GC/CHLAMYDIA PROBE AMP (~~LOC~~) NOT AT ARMC
Chlamydia: NEGATIVE
Comment: NEGATIVE
Comment: NORMAL
Neisseria Gonorrhea: NEGATIVE

## 2023-05-26 ENCOUNTER — Other Ambulatory Visit: Payer: Self-pay

## 2023-05-26 ENCOUNTER — Inpatient Hospital Stay (HOSPITAL_COMMUNITY)
Admission: AD | Admit: 2023-05-26 | Discharge: 2023-05-26 | Disposition: A | Discharge status: Home / Self Care | Payer: Self-pay | Attending: Obstetrics and Gynecology | Admitting: Obstetrics and Gynecology

## 2023-05-26 ENCOUNTER — Encounter (HOSPITAL_COMMUNITY): Payer: Self-pay | Admitting: Obstetrics and Gynecology

## 2023-05-26 DIAGNOSIS — Z3A15 15 weeks gestation of pregnancy: Secondary | ICD-10-CM | POA: Insufficient documentation

## 2023-05-26 DIAGNOSIS — Z881 Allergy status to other antibiotic agents status: Secondary | ICD-10-CM | POA: Insufficient documentation

## 2023-05-26 DIAGNOSIS — O4690 Antepartum hemorrhage, unspecified, unspecified trimester: Secondary | ICD-10-CM

## 2023-05-26 DIAGNOSIS — Z87891 Personal history of nicotine dependence: Secondary | ICD-10-CM | POA: Insufficient documentation

## 2023-05-26 DIAGNOSIS — O209 Hemorrhage in early pregnancy, unspecified: Secondary | ICD-10-CM | POA: Diagnosis not present

## 2023-05-26 DIAGNOSIS — Z833 Family history of diabetes mellitus: Secondary | ICD-10-CM | POA: Insufficient documentation

## 2023-05-26 DIAGNOSIS — Z8249 Family history of ischemic heart disease and other diseases of the circulatory system: Secondary | ICD-10-CM | POA: Diagnosis not present

## 2023-05-26 LAB — URINALYSIS, ROUTINE W REFLEX MICROSCOPIC
Bacteria, UA: NONE SEEN
Bilirubin Urine: NEGATIVE
Glucose, UA: NEGATIVE mg/dL
Ketones, ur: 5 mg/dL — AB
Nitrite: NEGATIVE
Protein, ur: NEGATIVE mg/dL
Specific Gravity, Urine: 1.013 (ref 1.005–1.030)
pH: 5 (ref 5.0–8.0)

## 2023-05-26 LAB — WET PREP, GENITAL
Clue Cells Wet Prep HPF POC: NONE SEEN
Sperm: NONE SEEN
Trich, Wet Prep: NONE SEEN
WBC, Wet Prep HPF POC: >=10 — AB (ref <10)
Yeast Wet Prep HPF POC: NONE SEEN

## 2023-05-26 NOTE — MAU Note (Signed)
 MAU Triage Note  .Brianna Ali is a 38 y.o. at [redacted]w[redacted]d here in MAU reporting: red vaginal bleeding starting 5/1 pm going thru "4 panty liners in 6 hours" and clot this morning when going to restroom after going to MAU 4/30 with brown discharge. Pt denies sex in last 24 hours LMP: na Onset of complaint: 5/1 pm Pain score: 3-4/10 Vitals:   05/26/23 0918 05/26/23 0920  BP: 113/79   Pulse: (!) 104   Resp: 16   Temp: 98.3 F (36.8 C)   SpO2:  100%     FHT: 159  Lab orders placed from triage: ua

## 2023-05-26 NOTE — MAU Provider Note (Signed)
 History     CSN: [id number]  Arrival date and time: 05/26/23 0843   Event Date/Time   First Provider Initiated Contact with Patient    Chief Complaint  Patient presents with   Vaginal Bleeding    HPI  Brianna Ali is a 38 y.o. G3P2002 at [redacted]w[redacted]d who presents to the MAU for vaginal bleeding. Pt reports bright red vaginal bleeding, started 5/1. She was seen in MAU on 4/30 for abnormal vaginal discharge. Swabs at the time were negative. She was given reassurance and discharged home with return precautions. She reports having onset of scant amount of bright red bleeding since yesterday. She reports using four pantyliners in six hours. No clots noted. No recent intercourse.   Past Medical History:  Diagnosis Date   Anemia    BV (bacterial vaginosis)     Past Surgical History:  Procedure Laterality Date   NO PAST SURGERIES      Family History  Problem Relation Age of Onset   Hypertension Mother    Stroke Mother    Diabetes Maternal Grandmother     Social History   Tobacco Use   Smoking status: Former    Types: Cigars   Smokeless tobacco: Never  Vaping Use   Vaping status: Never Used  Substance Use Topics   Alcohol use: Yes   Drug use: No    Allergies:  Allergies  Allergen Reactions   Ciprofloxacin Rash and Hives   Metronidazole  Hives    No medications prior to admission.    ROS reviewed and pertinent positives and negatives as documented in HPI.  Physical Exam   Blood pressure 100/70, pulse (!) 106, temperature 98.3 F (36.8 C), temperature source Oral, resp. rate 15, height 5\' 1"  (1.549 m), weight 50.8 kg, last menstrual period 02/08/2023, SpO2 100%.  Physical Exam Exam conducted with a chaperone present.  Constitutional:      General: She is not in acute distress.    Appearance: Normal appearance. She is not ill-appearing.  HENT:     Head: Normocephalic and atraumatic.  Cardiovascular:     Rate and Rhythm: Normal rate.  Pulmonary:     Effort:  Pulmonary effort is normal.     Breath sounds: Normal breath sounds.  Abdominal:     Palpations: Abdomen is soft.     Tenderness: There is no abdominal tenderness. There is no guarding.  Genitourinary:       Comments: Speculum exam performed, other than small area of bleeding, no bleeding from cervical os or other areas suspicious for recent bleeding; scant thin and clear vaginal discharge Musculoskeletal:        General: Normal range of motion.  Skin:    General: Skin is warm and dry.     Findings: No rash.  Neurological:     General: No focal deficit present.     Mental Status: She is alert and oriented to person, place, and time.   BSUS performed showing IUP, +FCA and fetal movement  MAU Course  Procedures  MDM 38 y.o. G3P2002 at [redacted]w[redacted]d presenting for vaginal bleeding of second trimester. Pt reports bleeding has been improving, now only has brown discharge c/w old blood. She is well appearing, has benign abdominal exam. Cervix with area suspicious for recent bleeding noted, no blood noted to be coming from os. BSUS performed for pt reassurance. Pt Rh positive. Given degree of friability of cervix, suspect pt may have touched cervix w recent swabs leading to bleeding. Reassurance and return precautions  reviewed.   Assessment and Plan     ICD-10-CM   1. Vaginal bleeding during pregnancy  O46.90     2. [redacted] weeks gestation of pregnancy  Z3A.15     Friable cervix, suspect this is cause of bleeding Return precautions addressed Discharged in stable condition    Melanie Spires, MD OB Fellow, Faculty Practice [facility], Center for [facility]  05/26/2023, 1:17 PM

## 2023-05-27 ENCOUNTER — Encounter (HOSPITAL_COMMUNITY): Payer: Self-pay | Admitting: Obstetrics & Gynecology

## 2023-05-27 ENCOUNTER — Observation Stay (HOSPITAL_COMMUNITY)
Admission: AD | Admit: 2023-05-27 | Discharge: 2023-05-28 | DRG: 807 | Disposition: A | Discharge status: Home / Self Care | Payer: Self-pay | Attending: Obstetrics & Gynecology | Admitting: Obstetrics & Gynecology

## 2023-05-27 DIAGNOSIS — O42912 Preterm premature rupture of membranes, unspecified as to length of time between rupture and onset of labor, second trimester: Secondary | ICD-10-CM | POA: Diagnosis not present

## 2023-05-27 DIAGNOSIS — Z881 Allergy status to other antibiotic agents status: Secondary | ICD-10-CM

## 2023-05-27 DIAGNOSIS — O42012 Preterm premature rupture of membranes, onset of labor within 24 hours of rupture, second trimester: Principal | ICD-10-CM | POA: Diagnosis present

## 2023-05-27 DIAGNOSIS — Z3A15 15 weeks gestation of pregnancy: Secondary | ICD-10-CM | POA: Diagnosis not present

## 2023-05-27 DIAGNOSIS — O039 Complete or unspecified spontaneous abortion without complication: Secondary | ICD-10-CM | POA: Diagnosis not present

## 2023-05-27 DIAGNOSIS — O021 Missed abortion: Secondary | ICD-10-CM | POA: Diagnosis not present

## 2023-05-27 MED ORDER — MISOPROSTOL 200 MCG PO TABS
ORAL_TABLET | ORAL | Status: AC
  Administered 2023-05-27: 800 ug via ORAL
  Filled 2023-05-27: qty 4

## 2023-05-27 MED ORDER — FENTANYL CITRATE (PF) 100 MCG/2ML IJ SOLN
50.0000 ug | Freq: Once | INTRAMUSCULAR | Status: AC
  Administered 2023-05-27: 50 ug via INTRAVENOUS
  Filled 2023-05-27: qty 2

## 2023-05-27 MED ORDER — MISOPROSTOL 200 MCG PO TABS: 800.0000 ug | ORAL_TABLET | Freq: Once | ORAL | Status: AC

## 2023-05-27 NOTE — MAU Note (Signed)
..  Brianna Ali is a 38 y.o. at [redacted]w[redacted]d here in MAU reporting: patient brought directly to room d/t pain. Reports vaginal bleeding, has been soaking through pads changed pad 7 times today. Lower abdominal cramping that started this morning and subsided after tylenol , come back around 2130.  Pain score: 10/10 Vitals:   05/27/23 2228  BP: 139/76  Pulse: (!) 115  Resp: 20  Temp: 98.3 F (36.8 C)  SpO2: 100%     WUJ:[id number] but no number showed on screen, difficult d/t pt pain.  Lab orders placed from triage:

## 2023-05-27 NOTE — MAU Provider Note (Signed)
 Severe Abd Pain     S [name] is a 38 y.o. G63P2002 pregnant female at [redacted]w[redacted]d who presents to MAU today with complaint of severe abdominal pain. Pt states VB and soaking through pads and had to change pad 7 times today in association with lower abdominal cramping that started this morning.  Subsided after tylenol  however back around 2130 and severe.  FHT audible but doppler didn't report number.     Pertinent items noted in HPI and remainder of comprehensive ROS otherwise negative.    O BP 106/69   Pulse 94   Temp 98.3 F (36.8 C) (Oral)   Resp 20   LMP 02/08/2023   SpO2 100%  Physical Exam Vitals and nursing note reviewed. Exam conducted with a chaperone present.  Constitutional:      General: She is not in acute distress.    Appearance: She is well-developed and normal weight. She is ill-appearing.  HENT:     Head: Normocephalic and atraumatic.     Mouth/Throat:     Mouth: Mucous membranes are moist.     Pharynx: Oropharynx is clear.  Eyes:     Extraocular Movements: Extraocular movements intact.  Cardiovascular:     Rate and Rhythm: Normal rate.  Pulmonary:     Effort: Pulmonary effort is normal. No respiratory distress.  Abdominal:     General: Abdomen is flat.     Palpations: Abdomen is soft.     Tenderness: There is no abdominal tenderness.  Genitourinary:    Vagina: Normal.     Cervix: Dilated.     Rectum: Normal.     Comments: Membranes noted at the external os and fetus seen floating in the sac, pooling dark blood products in posterior fornix, as updating patient SROM with delivery of previable female fetus with signs of life for approximately of life prior to passing   Skin:    General: Skin is warm and dry.  Neurological:     Mental Status: She is alert and oriented to person, place, and time.     Motor: No weakness.  Psychiatric:        Mood and Affect: Mood is anxious.        Behavior: Behavior normal.     Comments: Obviously tearful          MDM: MAU Course: On chart review SIUP with FHR of 165 on 4/22. No subchorionic hemorrhage.  Was also seen 5/2 for improved VB with bleeding noted on the cervix for possible friable cervix / laceration from swabs however no blood from cervical os. Bleeding reported as much more significant. Gave pt IV fentanyl  50 x 1.  BSUS concerning for dilated cervix and troubles establishing heart beat. Told pt concern she may be actively miscarrying and she was agreeable for speculum examination. Upon speculum exam noted to have bulging membranes at external os after pooling blood products collected.  SROM happened as speculum still in place with delivery of 15 week fetus into the speculum.  Extreme care taken to remove speculum without crushing the fetus.  Fetus with movements and heart beat for approximately of life.  Cord clamped and pt given 800mg  Buccal cytotec with delivery of placenta.  BSUS with thin uterine strip reassuring of now returned products.  Mom's pelvic pain and bleeding much improved after delivery of placenta.  Pt offered anora testing and she accepts.      AP #15 weeks  #PTD of previable [redacted] week gestation #

## 2023-05-28 ENCOUNTER — Encounter (HOSPITAL_COMMUNITY): Payer: Self-pay | Admitting: Obstetrics & Gynecology

## 2023-05-28 DIAGNOSIS — Z881 Allergy status to other antibiotic agents status: Secondary | ICD-10-CM | POA: Diagnosis not present

## 2023-05-28 DIAGNOSIS — O039 Complete or unspecified spontaneous abortion without complication: Secondary | ICD-10-CM | POA: Diagnosis present

## 2023-05-28 DIAGNOSIS — R109 Unspecified abdominal pain: Secondary | ICD-10-CM | POA: Diagnosis present

## 2023-05-28 DIAGNOSIS — O021 Missed abortion: Secondary | ICD-10-CM | POA: Diagnosis not present

## 2023-05-28 DIAGNOSIS — O43122 Velamentous insertion of umbilical cord, second trimester: Secondary | ICD-10-CM | POA: Diagnosis not present

## 2023-05-28 DIAGNOSIS — O411220 Chorioamnionitis, second trimester, not applicable or unspecified: Secondary | ICD-10-CM | POA: Diagnosis not present

## 2023-05-28 DIAGNOSIS — O42012 Preterm premature rupture of membranes, onset of labor within 24 hours of rupture, second trimester: Secondary | ICD-10-CM | POA: Diagnosis present

## 2023-05-28 DIAGNOSIS — Z3A15 15 weeks gestation of pregnancy: Secondary | ICD-10-CM | POA: Diagnosis not present

## 2023-05-28 MED ORDER — ONDANSETRON 4 MG PO TBDP: 4.0000 mg | ORAL_TABLET | Freq: Three times a day (TID) | ORAL | 0 refills | Status: DC | PRN

## 2023-05-28 MED ORDER — OXYCODONE HCL 5 MG PO CAPS: 5.0000 mg | ORAL_CAPSULE | ORAL | 0 refills | Status: DC | PRN

## 2023-05-28 NOTE — MAU Note (Signed)
 Dr Scherrie Curt at bedside . Unable to determine FHR with U/S. Assited with SSE. Telescoping membranes noted. PT and SO upset and crying during exam.  SROM at 2257 NSVD breech presentation of Fetus. Cord clamped and cut by Dr. Scherrie Curt. Pt did not want to see fetus at first. After afew minutes she wanted to hold fetus but wanted it wrapped so she could not see. Place fetus on chest.

## 2023-05-28 NOTE — Discharge Summary (Signed)
 Severe Abd Pain     S [name] is a 38 y.o. [id number] pregnant female at [redacted]w[redacted]d who presents to MAU today with complaint of severe abdominal pain. Pt states VB and soaking through pads and had to change pad 7 times today in association with lower abdominal cramping that started this morning.  Subsided after tylenol  however back around 2130 and severe.  FHT audible but doppler didn't report number.     Pertinent items noted in HPI and remainder of comprehensive ROS otherwise negative.    O BP 106/69   Pulse 94   Temp 98.3 F (36.8 C) (Oral)   Resp 20   LMP 02/08/2023   SpO2 100%  Physical Exam Vitals and nursing note reviewed. Exam conducted with a chaperone present.  Constitutional:      General: She is not in acute distress.    Appearance: She is well-developed and normal weight. She is ill-appearing.  HENT:     Head: Normocephalic and atraumatic.     Mouth/Throat:     Mouth: Mucous membranes are moist.     Pharynx: Oropharynx is clear.  Eyes:     Extraocular Movements: Extraocular movements intact.  Cardiovascular:     Rate and Rhythm: Normal rate.  Pulmonary:     Effort: Pulmonary effort is normal. No respiratory distress.  Abdominal:     General: Abdomen is flat.     Palpations: Abdomen is soft.     Tenderness: There is no abdominal tenderness.  Genitourinary:    Vagina: Normal.     Cervix: Dilated.     Rectum: Normal.     Comments: Membranes noted at the external os and fetus seen floating in the sac, pooling dark blood products in posterior fornix, as updating patient SROM with delivery of previable female fetus with signs of life for approximately of life prior to passing   Skin:    General: Skin is warm and dry.  Neurological:     Mental Status: She is alert and oriented to person, place, and time.     Motor: No weakness.  Psychiatric:        Mood and Affect: Mood is anxious.        Behavior: Behavior normal.     Comments: Obviously tearful          MDM: MAU Course: On chart review SIUP with FHR of 165 on 4/22. No subchorionic hemorrhage.  Was also seen 5/2 for improved VB with bleeding noted on the cervix for possible friable cervix / laceration from swabs however no blood from cervical os. Bleeding reported as much more significant. Gave pt IV fentanyl  50 x 1.  BSUS concerning for dilated cervix and troubles establishing heart beat. Told pt concern she may be actively miscarrying and she was agreeable for speculum examination. Upon speculum exam noted to have bulging membranes at external os after pooling blood products collected.  SROM happened as speculum still in place with delivery of 15 week fetus into the speculum.  Extreme care taken to remove speculum without crushing the fetus.  Fetus with movements and heart beat for approximately of life.  Cord clamped and pt given 800mg  Buccal cytotec with delivery of placenta.  BSUS with thin uterine strip reassuring of now returned products.  Mom's pelvic pain and bleeding much improved after delivery of placenta.  Pt offered anora testing and she accepts.      AP #15 weeks  #PTD of previable [redacted] week gestation #  Fetal demise    Discharge from MAU in stable condition with strict/usual precautions Follow up at desired OBGYN as scheduled for ongoing prenatal care   Allergies as of 05/28/2023         Reactions    Ciprofloxacin Rash, Hives    Metronidazole  Hives            Medication List       TAKE these medications     acetaminophen  500 MG tablet Commonly known as: TYLENOL  Take 500 mg by mouth every 6 (six) hours as needed for moderate pain (pain score 4-6).    ferrous sulfate 325 (65 FE) MG tablet Take 325 mg by mouth daily with breakfast.    ondansetron  4 MG disintegrating tablet Commonly known as: ZOFRAN -ODT Take 1 tablet (4 mg total) by mouth every 8 (eight) hours as needed for nausea or vomiting.    oxycodone  5 MG capsule Commonly known as: OXY-IR Take 1 capsule (5  mg total) by mouth every 4 (four) hours as needed (breakthrough pain).    pantoprazole  40 MG tablet Commonly known as: PROTONIX  Take 1 tablet (40 mg total) by mouth daily.    prenatal multivitamin Tabs tablet Take 1 tablet by mouth daily at 12 noon.             Ebony Goldstein, MD 05/28/2023 3:19 AM

## 2023-05-29 DIAGNOSIS — Z1331 Encounter for screening for depression: Secondary | ICD-10-CM | POA: Diagnosis not present

## 2023-05-29 DIAGNOSIS — O039 Complete or unspecified spontaneous abortion without complication: Secondary | ICD-10-CM | POA: Diagnosis not present

## 2023-05-29 DIAGNOSIS — D6861 Antiphospholipid syndrome: Secondary | ICD-10-CM | POA: Diagnosis not present

## 2023-05-31 LAB — SURGICAL PATHOLOGY

## 2023-06-05 DIAGNOSIS — Z419 Encounter for procedure for purposes other than remedying health state, unspecified: Secondary | ICD-10-CM | POA: Diagnosis not present

## 2023-06-06 LAB — ANORA MISCARRIAGE TEST - FRESH

## 2023-06-13 ENCOUNTER — Telehealth (HOSPITAL_COMMUNITY): Payer: Self-pay | Admitting: *Deleted

## 2023-06-13 NOTE — Telephone Encounter (Signed)
 06/13/2023  Name: Brianna Ali MRN: [id number] DOB: 08-19-1985  Reason for Call:  Transition of Care Hospital Discharge Call  Contact Status: Patient Contact Status: Complete  Language assistant needed: Interpreter Mode: Interpreter Not Needed        Follow-Up Questions: Do You Have Any Concerns About Your Health As You Heal From Delivery?: No Do You Have Any Concerns About Your Infants Health?:  (15 wk loss)  Dimple Francis Postnatal Depression Scale:  In the Past 7 Days:    PHQ2-9 Depression Scale:     Discharge Follow-up: Edinburgh score requires follow up?:  (did not complete, patient reports talking to midwife this morning and having good support system) Patient was advised of the following resources:: Bereavement (Grief support for pregnancy, infant and child loss)  Post-discharge interventions: NA  Pearlie Bougie, RN 06/13/2023 10:13

## 2023-06-29 DIAGNOSIS — Z862 Personal history of diseases of the blood and blood-forming organs and certain disorders involving the immune mechanism: Secondary | ICD-10-CM | POA: Diagnosis not present

## 2023-06-29 DIAGNOSIS — O021 Missed abortion: Secondary | ICD-10-CM | POA: Diagnosis not present

## 2023-07-03 IMAGING — CR DG CHEST 2V
2 series · 2 of 2 positions shown · non-contrast
Comparison: None.

CLINICAL DATA: Cough and fatigue for 1 month

EXAM:
CHEST - 2 VIEW

[w chest pa]
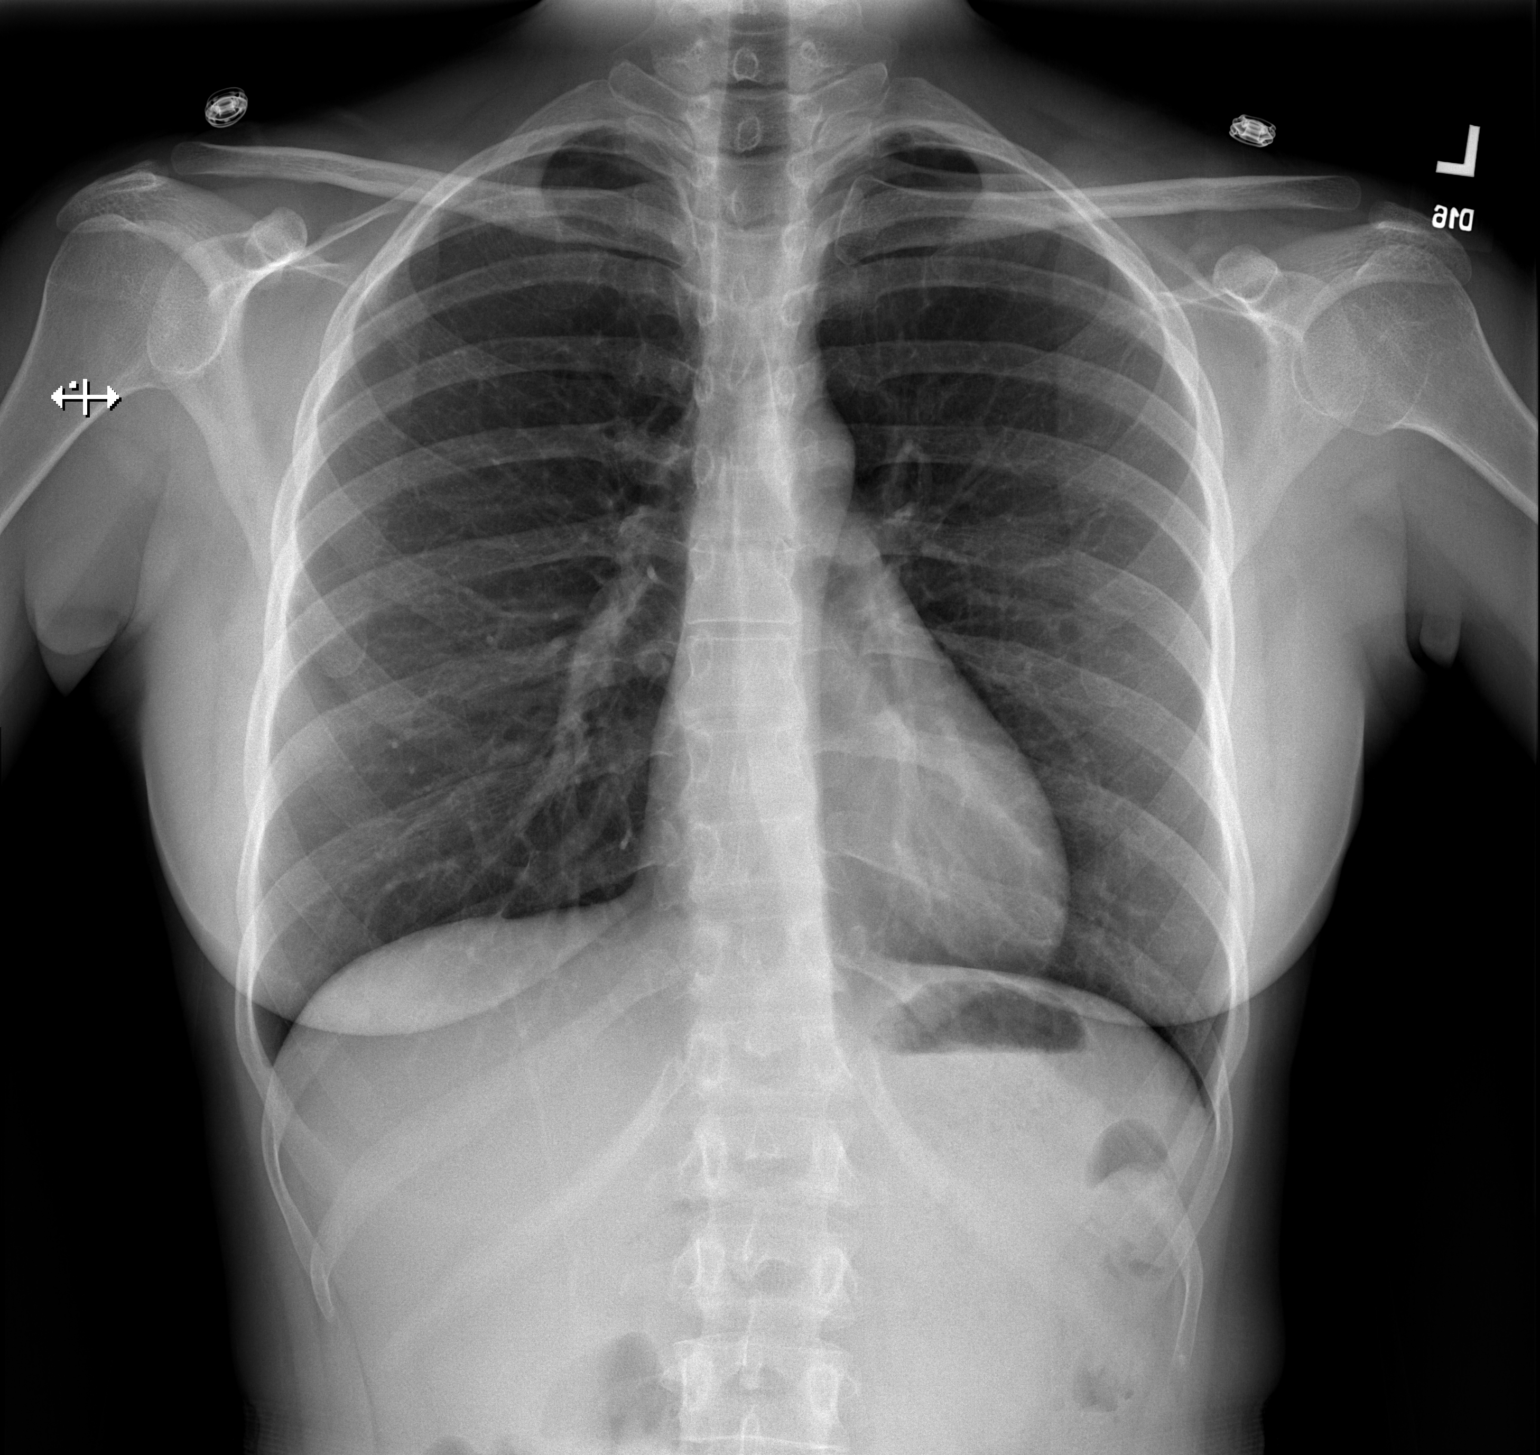

[w chest lat]
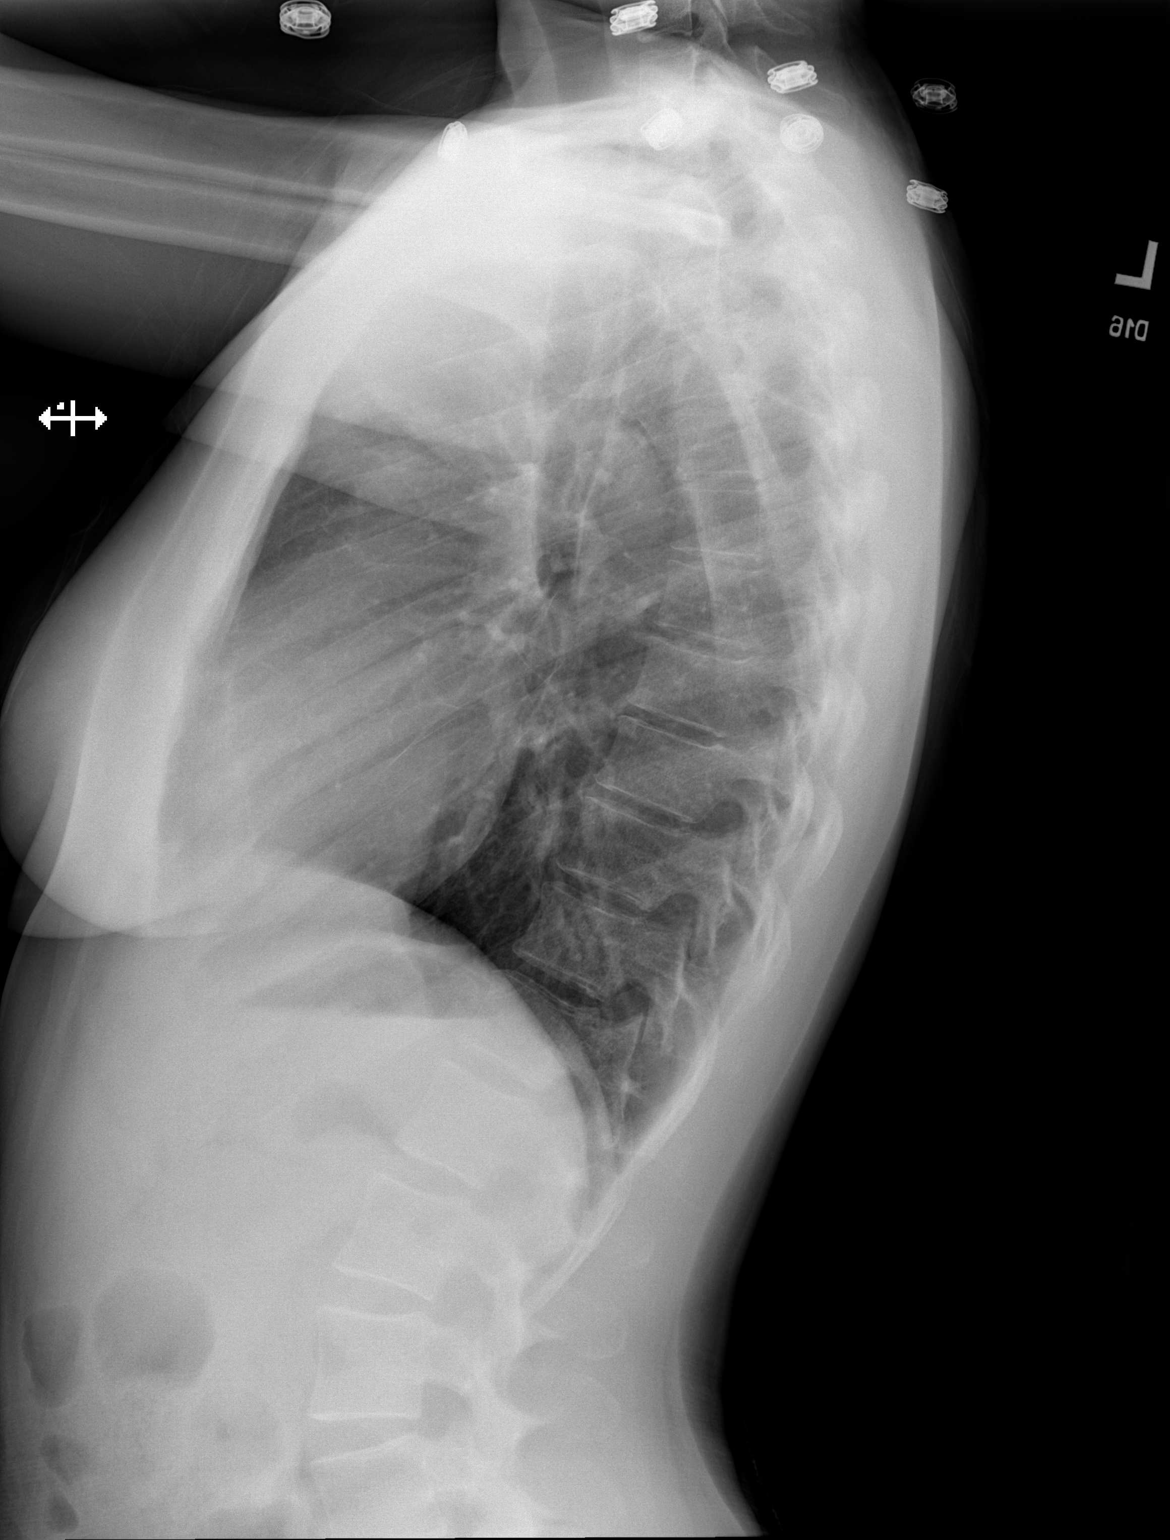

[2 of 2 positions shown; findings below may reference images not displayed]

FINDINGS: The heart size and mediastinal contours are within normal limits.
Both lungs are clear. The visualized skeletal structures are
unremarkable.
IMPRESSION: No active cardiopulmonary disease.

## 2023-07-06 DIAGNOSIS — Z419 Encounter for procedure for purposes other than remedying health state, unspecified: Secondary | ICD-10-CM | POA: Diagnosis not present

## 2023-07-27 ENCOUNTER — Inpatient Hospital Stay

## 2023-07-27 ENCOUNTER — Inpatient Hospital Stay: Attending: Oncology | Admitting: Oncology

## 2023-07-27 ENCOUNTER — Encounter: Payer: Self-pay | Admitting: Oncology

## 2023-07-27 ENCOUNTER — Other Ambulatory Visit: Payer: Self-pay | Admitting: Oncology

## 2023-07-27 VITALS — BP 109/86 | HR 86 | Temp 98.1°F | Resp 14 | Ht 62.0 in | Wt 115.2 lb

## 2023-07-27 DIAGNOSIS — Z87891 Personal history of nicotine dependence: Secondary | ICD-10-CM | POA: Diagnosis not present

## 2023-07-27 DIAGNOSIS — D509 Iron deficiency anemia, unspecified: Secondary | ICD-10-CM | POA: Diagnosis not present

## 2023-07-27 LAB — CBC WITH DIFFERENTIAL (CANCER CENTER ONLY)
Abs Immature Granulocytes: 0.01 10*3/uL (ref 0.00–0.07)
Basophils Absolute: 0.1 10*3/uL (ref 0.0–0.1)
Basophils Relative: 1 %
Eosinophils Absolute: 0 10*3/uL (ref 0.0–0.5)
Eosinophils Relative: 1 %
HCT: 31.3 % — ABNORMAL LOW (ref 36.0–46.0)
Hemoglobin: 10.3 g/dL — ABNORMAL LOW (ref 12.0–15.0)
Immature Granulocytes: 0 %
Lymphocytes Relative: 31 %
Lymphs Abs: 1.9 10*3/uL (ref 0.7–4.0)
MCH: 24 pg — ABNORMAL LOW (ref 26.0–34.0)
MCHC: 32.9 g/dL (ref 30.0–36.0)
MCV: 73 fL — ABNORMAL LOW (ref 80.0–100.0)
Monocytes Absolute: 0.3 10*3/uL (ref 0.1–1.0)
Monocytes Relative: 6 %
Neutro Abs: 3.6 10*3/uL (ref 1.7–7.7)
Neutrophils Relative %: 61 %
Platelet Count: 290 10*3/uL (ref 150–400)
RBC: 4.29 MIL/uL (ref 3.87–5.11)
RDW: 15.9 % — ABNORMAL HIGH (ref 11.5–15.5)
WBC Count: 5.9 10*3/uL (ref 4.0–10.5)
nRBC: 0 % (ref 0.0–0.2)

## 2023-07-27 LAB — IRON AND IRON BINDING CAPACITY (CC-WL,HP ONLY)
Iron: 33 ug/dL (ref 28–170)
Saturation Ratios: 6 % — ABNORMAL LOW (ref 10.4–31.8)
TIBC: 575 ug/dL — ABNORMAL HIGH (ref 250–450)
UIBC: 542 ug/dL — ABNORMAL HIGH (ref 148–442)

## 2023-07-27 LAB — CMP (CANCER CENTER ONLY)
ALT: 8 U/L (ref 0–44)
AST: 15 U/L (ref 15–41)
Albumin: 4.5 g/dL (ref 3.5–5.0)
Alkaline Phosphatase: 50 U/L (ref 38–126)
Anion gap: 6 (ref 5–15)
BUN: 11 mg/dL (ref 6–20)
CO2: 27 mmol/L (ref 22–32)
Calcium: 9.4 mg/dL (ref 8.9–10.3)
Chloride: 104 mmol/L (ref 98–111)
Creatinine: 0.7 mg/dL (ref 0.44–1.00)
GFR, Estimated: >60 mL/min (ref >60)
Glucose, Bld: 87 mg/dL (ref 70–99)
Potassium: 4 mmol/L (ref 3.5–5.1)
Sodium: 137 mmol/L (ref 135–145)
Total Bilirubin: 0.4 mg/dL (ref 0.0–1.2)
Total Protein: 8.3 g/dL — ABNORMAL HIGH (ref 6.5–8.1)

## 2023-07-27 LAB — FERRITIN: Ferritin: 6 ng/mL — ABNORMAL LOW (ref 11–307)

## 2023-07-27 LAB — VITAMIN B12: Vitamin B-12: 350 pg/mL (ref 180–914)

## 2023-07-27 LAB — FOLATE: Folate: 12.7 ng/mL (ref >5.9)

## 2023-07-27 NOTE — Assessment & Plan Note (Addendum)
 Iron deficiency anemia likely secondary to recent miscarriage and bleeding during pregnancy. Hemoglobin levels have improved to 10.3 with oral iron supplementation, but further improvement is needed. Symptoms include low energy levels and cravings for ice chips, consistent with anemia.  Labs today showed persistent anemia with hemoglobin of 10.3, hematocrit 31.3, MCV 73.  White count and platelet count are within normal limits.  CMP unremarkable.  Iron studies showed persistent iron deficiency with ferritin of 6, iron saturation 6%, increased iron binding capacity.  B12 and folate are within normal limits.  Will arrange for IV iron with Feraheme x 2 doses at our infusion center on [address].  Patient will be contacted with appointment times.  - Advise to continue oral iron supplementation once daily or every other day with vitamin C to enhance absorption.  RTC in 4 months for follow-up with repeat labs.

## 2023-07-27 NOTE — Progress Notes (Signed)
 [facility]  HEMATOLOGY CLINIC CONSULTATION NOTE   PATIENT NAME: Brianna Ali   MR#: [id number] DOB: September 14, 1985  DATE OF SERVICE: 07/27/2023  Patient Care Team: Patient, No Pcp Per as PCP - General (General Practice)  REASON FOR CONSULTATION/ CHIEF COMPLAINT:  Evaluation of anemia.  ASSESSMENT & PLAN:   Brianna Ali is a 38 y.o. lady with a past medical history of sickle cell trait, was referred to our service for evaluation of microcytic anemia.    Iron deficiency anemia Iron deficiency anemia likely secondary to recent miscarriage and bleeding during pregnancy. Hemoglobin levels have improved to 10.3 with oral iron supplementation, but further improvement is needed. Symptoms include low energy levels and cravings for ice chips, consistent with anemia.  Labs today showed persistent anemia with hemoglobin of 10.3, hematocrit 31.3, MCV 73.  White count and platelet count are within normal limits.  CMP unremarkable.  Iron studies showed persistent iron deficiency with ferritin of 6, iron saturation 6%, increased iron binding capacity.  B12 and folate are within normal limits.  Will arrange for IV iron with Feraheme x 2 doses at our infusion center on [address].  Patient will be contacted with appointment times.  - Advise to continue oral iron supplementation once daily or every other day with vitamin C to enhance absorption.  RTC in 4 months for follow-up with repeat labs.   Since the cause of anemia seems to be obvious from iron deficiency, I am not pursuing extensive workup at this time.  If inadequate response to iron replacement is noted, we will pursue workup to rule out other etiologies.   I reviewed lab results and outside records for this visit and discussed relevant results with the patient. Diagnosis, plan of care and treatment options were also discussed in detail with the patient. Opportunity provided to ask questions and answers provided to her apparent  satisfaction. Provided instructions to call our clinic with any problems, questions or concerns prior to return visit. I recommended to continue follow-up with PCP and sub-specialists. She verbalized understanding and agreed with the plan. No barriers to learning was detected.  Berry Godsey, MD [facility] - A DEPT OF [facility] [address] [place] [address] Dept: [phone] Dept Fax: [phone]  07/27/2023 2:37 PM  HISTORY OF PRESENT ILLNESS:  Discussed the use of AI scribe software for clinical note transcription with the patient, who gave verbal consent to proceed.  History of Present Illness Brianna Ali is a 38 year old female with anemia who presents for evaluation and management of her condition.   She was referred by her OB GYN doctor for anemia. On 05/29/2023, labs showed hemoglobin of 8.8, hematocrit 26.7, MCV 80.  White count 9300 with normal differential.  Platelet count 212,000.  She has a history of anemia during her first pregnancy, which improved with iron supplements taken for approximately 30 days. She did not experience anemia during her second pregnancy. Recently, she had a miscarriage at [redacted] weeks gestation, preceded by persistent bleeding and preterm labor pains. She visited the hospital three times due to bleeding, which started as spotting and progressed to passing clots. Since the miscarriage, she has not experienced further bleeding issues. Workup for antiphospholipid antibody syndrome was negative.  She is currently taking iron supplements twice a day, although she sometimes forgets to take them. When taken, she experiences constipation as a side effect.  Her recent blood work showed a hemoglobin level of 9 g/dL after starting iron supplements.  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 She has no history of heavy menstrual cycles, epistaxis, gum bleeds, or  blood in stools.  She reports low energy levels and a strong craving for ice chips, which she describes as 'horrible'. She also mentions a craving to smell soap. No chest pain, trouble breathing, or unusual food cravings.    MEDICAL HISTORY:  Past Medical History:  Diagnosis Date   Anemia    BV (bacterial vaginosis)     SURGICAL HISTORY: Past Surgical History:  Procedure Laterality Date   NO PAST SURGERIES      SOCIAL HISTORY: She reports that she has quit smoking. Her smoking use included cigars. She has never used smokeless tobacco. She reports current alcohol use. She reports that she does not use drugs. Social History   Socioeconomic History   Marital status: Single    Spouse name: Not on file   Number of children: Not on file   Years of education: Not on file   Highest education level: Not on file  Occupational History   Not on file  Tobacco Use   Smoking status: Former    Types: Cigars   Smokeless tobacco: Never  Vaping Use   Vaping status: Never Used  Substance and Sexual Activity   Alcohol use: Yes   Drug use: No   Sexual activity: Not on file  Other Topics Concern   Not on file  Social History Narrative   Not on file   Social Drivers of Health   Financial Resource Strain: Low Risk  (02/10/2022)   Received from [facility] Resource Strain    In the past 12 months has the electric, gas, oil, or water company threatened to shut off services in your home?: No    Do problems getting child care make it difficult for you to work or study?: No    Do you have a high school degree?: Yes    Do you have a job?: Yes    How often does this describe you? I don't have enough money to pay my bills.: Sometimes  Food Insecurity: No Food Insecurity (07/27/2023)   Hunger Vital Sign    Worried About Running Out of Food in the Last Year: Never true    Ran Out of Food in the Last Year: Never true  Transportation Needs: No Transportation Needs (07/27/2023)    PRAPARE - Administrator, Civil Service (Medical): No    Lack of Transportation (Non-Medical): No  Physical Activity: High Risk (02/10/2022)   Received from [facility]   Physical Activity    How often do you engage in moderate physical activity for 30 minutes or more?: Never    How often do you engage in vigorous physical activity for 20 minutes or more?: Never    How many hours per day do you spend sitting?: 6 to 8 hours  Stress: Medium Risk (02/10/2022)   Received from [facility]   Stress    How would you describe the stress in your daily life?: Moderate    How effective are you with dealing with your stress?: Moderately effective  Social Connections: Not on file  Intimate Partner Violence: Not At Risk (07/27/2023)   Humiliation, Afraid, Rape, and Kick questionnaire    Fear of Current or Ex-Partner: No    Emotionally Abused: No  Physically Abused: No    Sexually Abused: No    FAMILY HISTORY: Family History  Problem Relation Age of Onset   Hypertension Mother    Stroke Mother    Diabetes Maternal Grandmother     ALLERGIES:  She is allergic to ciprofloxacin and metronidazole .  MEDICATIONS:  Current Outpatient Medications  Medication Sig Dispense Refill   acetaminophen  (TYLENOL ) 500 MG tablet Take 500 mg by mouth every 6 (six) hours as needed for moderate pain (pain score 4-6).     ferrous sulfate 325 (65 FE) MG tablet Take 325 mg by mouth daily with breakfast.     ondansetron  (ZOFRAN -ODT) 4 MG disintegrating tablet Take 1 tablet (4 mg total) by mouth every 8 (eight) hours as needed for nausea or vomiting. 15 tablet 0   oxycodone  (OXY-IR) 5 MG capsule Take 1 capsule (5 mg total) by mouth every 4 (four) hours as needed (breakthrough pain). 5 capsule 0   pantoprazole  (PROTONIX ) 40 MG tablet Take 1 tablet (40 mg total) by mouth daily. 30 tablet 0   Prenatal Vit-Fe Fumarate-FA (PRENATAL MULTIVITAMIN) TABS tablet Take 1 tablet by mouth daily at 12 noon.     No  current facility-administered medications for this visit.    REVIEW OF SYSTEMS:    Review of Systems - Oncology  All other pertinent systems were reviewed and were negative except as mentioned above.  PHYSICAL EXAMINATION:   Onc Performance Status - 07/27/23 1037       ECOG Perf Status   ECOG Perf Status Restricted in physically strenuous activity but ambulatory and able to carry out work of a light or sedentary nature, e.g., light house work, office work      KPS SCALE   KPS % SCORE Normal activity with effort, some s/s of disease          Vitals:   07/27/23 1021  BP: 109/86  Pulse: 86  Resp: 14  Temp: 98.1 F (36.7 C)  SpO2: 100%   Filed Weights   07/27/23 1021  Weight: 115 lb 3.2 oz (52.3 kg)    Physical Exam Constitutional:      General: She is not in acute distress.    Appearance: Normal appearance.  HENT:     Head: Normocephalic and atraumatic.  Cardiovascular:     Rate and Rhythm: Normal rate.     Heart sounds: Normal heart sounds.  Pulmonary:     Effort: Pulmonary effort is normal. No respiratory distress.     Breath sounds: Normal breath sounds.  Abdominal:     General: There is no distension.  Neurological:     General: No focal deficit present.     Mental Status: She is alert and oriented to person, place, and time.  Psychiatric:        Mood and Affect: Mood normal.        Behavior: Behavior normal.      LABORATORY DATA:   I have reviewed the data as listed.  Results for orders placed or performed in visit on 07/27/23  Folate  Result Value Ref Range   Folate 12.7 >5.9 ng/mL  Vitamin B12  Result Value Ref Range   Vitamin B-12 350 180 - 914 pg/mL  Ferritin  Result Value Ref Range   Ferritin 6 (L) 11 - 307 ng/mL  Iron and Iron Binding Capacity ([facility] only)  Result Value Ref Range   Iron 33 28 - 170 ug/dL   TIBC 424 (H) 250 - 450 ug/dL  Saturation Ratios 6 (L) 10.4 - 31.8 %   UIBC 542 (H) 148 - 442 ug/dL  CMP (Cancer Center  only)  Result Value Ref Range   Sodium 137 135 - 145 mmol/L   Potassium 4.0 3.5 - 5.1 mmol/L   Chloride 104 98 - 111 mmol/L   CO2 27 22 - 32 mmol/L   Glucose, Bld 87 70 - 99 mg/dL   BUN 11 6 - 20 mg/dL   Creatinine 9.29 9.55 - 1.00 mg/dL   Calcium 9.4 8.9 - 89.6 mg/dL   Total Protein 8.3 (H) 6.5 - 8.1 g/dL   Albumin 4.5 3.5 - 5.0 g/dL   AST 15 15 - 41 U/L   ALT 8 0 - 44 U/L   Alkaline Phosphatase 50 38 - 126 U/L   Total Bilirubin 0.4 0.0 - 1.2 mg/dL   GFR, Estimated >39 >39 mL/min   Anion gap 6 5 - 15  CBC with Differential (Cancer Center Only)  Result Value Ref Range   WBC Count 5.9 4.0 - 10.5 K/uL   RBC 4.29 3.87 - 5.11 MIL/uL   Hemoglobin 10.3 (L) 12.0 - 15.0 g/dL   HCT 68.6 (L) 63.9 - 53.9 %   MCV 73.0 (L) 80.0 - 100.0 fL   MCH 24.0 (L) 26.0 - 34.0 pg   MCHC 32.9 30.0 - 36.0 g/dL   RDW 84.0 (H) 88.4 - 84.4 %   Platelet Count 290 150 - 400 K/uL   nRBC 0.0 0.0 - 0.2 %   Neutrophils Relative % 61 %   Neutro Abs 3.6 1.7 - 7.7 K/uL   Lymphocytes Relative 31 %   Lymphs Abs 1.9 0.7 - 4.0 K/uL   Monocytes Relative 6 %   Monocytes Absolute 0.3 0.1 - 1.0 K/uL   Eosinophils Relative 1 %   Eosinophils Absolute 0.0 0.0 - 0.5 K/uL   Basophils Relative 1 %   Basophils Absolute 0.1 0.0 - 0.1 K/uL   Immature Granulocytes 0 %   Abs Immature Granulocytes 0.01 0.00 - 0.07 K/uL     RADIOGRAPHIC STUDIES:  No recent pertinent imaging studies available to review.  Orders Placed This Encounter  Procedures   CBC with Differential (Cancer Center Only)    Standing Status:   Future    Number of Occurrences:   1    Expiration Date:   07/26/2024   CMP (Cancer Center only)    Standing Status:   Future    Number of Occurrences:   1    Expiration Date:   07/26/2024   Iron and Iron Binding Capacity ([facility] only)    Standing Status:   Future    Number of Occurrences:   1    Expiration Date:   07/26/2024   Ferritin    Standing Status:   Future    Number of Occurrences:   1    Expiration  Date:   07/26/2024   Vitamin B12    Standing Status:   Future    Number of Occurrences:   1    Expiration Date:   07/26/2024   Folate    Standing Status:   Future    Number of Occurrences:   1    Expiration Date:   07/26/2024   CBC with Differential (Cancer Center Only)    Standing Status:   Future    Expected Date:   11/27/2023    Expiration Date:   02/25/2024   Iron and Iron Binding Capacity ([facility] only)  Standing Status:   Future    Expected Date:   11/27/2023    Expiration Date:   02/25/2024   Ferritin    Standing Status:   Future    Expected Date:   11/27/2023    Expiration Date:   02/25/2024    Future Appointments  Date Time Provider Department Center  11/30/2023 10:30 AM [facility] None  11/30/2023 11:00 AM Gregory Barrick, MD [facility] None     I spent a total of 40 minutes during this encounter with the patient including review of chart and various tests results, discussions about plan of care and coordination of care plan.  This document was completed utilizing speech recognition software. Grammatical errors, random word insertions, pronoun errors, and incomplete sentences are an occasional consequence of this system due to software limitations, ambient noise, and hardware issues. Any formal questions or concerns about the content, text or information contained within the body of this dictation should be directly addressed to the provider for clarification.

## 2023-07-31 ENCOUNTER — Telehealth: Payer: Self-pay

## 2023-07-31 NOTE — Telephone Encounter (Signed)
 Dr. Pasam, patient will be scheduled as soon as possible.  Auth Submission: NO AUTH NEEDED Site of care: Site of care: [facility] Payer: [employer] medicaid Medication & CPT/J Code(s) submitted: Feraheme (ferumoxytol) [id number] Diagnosis Code:  Route of submission (phone, fax, portal): portal Phone # Fax # Auth type: Buy/Bill PB Units/visits requested: 510mg  x 2 doses Reference number:  Approval from: 07/31/23 to 10/31/23

## 2023-08-08 ENCOUNTER — Ambulatory Visit

## 2023-08-08 VITALS — BP 108/74 | HR 68 | Temp 97.7°F | Resp 18 | Ht 62.0 in | Wt 114.0 lb

## 2023-08-08 DIAGNOSIS — D509 Iron deficiency anemia, unspecified: Secondary | ICD-10-CM

## 2023-08-08 MED ORDER — ACETAMINOPHEN 325 MG PO TABS
650.0000 mg | ORAL_TABLET | Freq: Once | ORAL | Status: AC
  Administered 2023-08-08: 650 mg via ORAL
  Filled 2023-08-08: qty 2

## 2023-08-08 MED ORDER — SODIUM CHLORIDE 0.9 % IV SOLN
510.0000 mg | Freq: Once | INTRAVENOUS | Status: AC
  Administered 2023-08-08: 510 mg via INTRAVENOUS
  Filled 2023-08-08: qty 17

## 2023-08-08 MED ORDER — DIPHENHYDRAMINE HCL 25 MG PO CAPS
25.0000 mg | ORAL_CAPSULE | Freq: Once | ORAL | Status: AC
  Administered 2023-08-08: 25 mg via ORAL
  Filled 2023-08-08: qty 1

## 2023-08-08 NOTE — Progress Notes (Signed)
 Diagnosis: Iron Deficiency Anemia  Provider:  Praveen Mannam MD  Procedure: IV Infusion  IV Type: Peripheral, IV Location: L Antecubital  Feraheme (Ferumoxytol ), Dose: 510 mg  Infusion Start Time: 0924  Infusion Stop Time: 0943  Post Infusion IV Care: Observation period completed and Peripheral IV Discontinued  Discharge: Condition: Good, Destination: Home . AVS Declined  Performed by:  Maximiano JONELLE Pouch, LPN

## 2023-08-15 ENCOUNTER — Ambulatory Visit

## 2023-08-15 VITALS — BP 116/82 | HR 74 | Temp 98.3°F | Resp 16 | Ht 62.0 in | Wt 113.6 lb

## 2023-08-15 DIAGNOSIS — D509 Iron deficiency anemia, unspecified: Secondary | ICD-10-CM

## 2023-08-15 MED ORDER — SODIUM CHLORIDE 0.9 % IV SOLN
510.0000 mg | Freq: Once | INTRAVENOUS | Status: AC
  Administered 2023-08-15: 510 mg via INTRAVENOUS
  Filled 2023-08-15: qty 17

## 2023-08-15 MED ORDER — ACETAMINOPHEN 325 MG PO TABS: 650.0000 mg | ORAL_TABLET | Freq: Once | ORAL | Status: DC

## 2023-08-15 MED ORDER — DIPHENHYDRAMINE HCL 25 MG PO CAPS
25.0000 mg | ORAL_CAPSULE | Freq: Once | ORAL | Status: DC
Start: 2023-08-15 — End: 2023-08-15

## 2023-08-15 NOTE — Progress Notes (Signed)
 Diagnosis: Iron Deficiency Anemia  Provider:  Praveen Mannam MD  Procedure: IV Infusion  IV Type: Peripheral, IV Location: L Antecubital  Feraheme (Ferumoxytol ), Dose: 510 mg  Infusion Start Time: 0857  Infusion Stop Time: 0913  Post Infusion IV Care: Observation period completed and Peripheral IV Discontinued  Discharge: Condition: Good, Destination: Home . AVS Declined  Performed by:  Maximiano JONELLE Pouch, LPN

## 2023-11-30 ENCOUNTER — Inpatient Hospital Stay: Admitting: Oncology

## 2023-11-30 ENCOUNTER — Inpatient Hospital Stay

## 2023-11-30 ENCOUNTER — Encounter: Payer: Self-pay | Admitting: Oncology

## 2023-11-30 ENCOUNTER — Inpatient Hospital Stay: Attending: Oncology

## 2023-11-30 VITALS — BP 108/73 | HR 65 | Temp 98.1°F | Resp 18 | Ht 62.0 in | Wt 118.0 lb

## 2023-11-30 DIAGNOSIS — D573 Sickle-cell trait: Secondary | ICD-10-CM | POA: Diagnosis not present

## 2023-11-30 DIAGNOSIS — D509 Iron deficiency anemia, unspecified: Secondary | ICD-10-CM

## 2023-11-30 LAB — CBC WITH DIFFERENTIAL (CANCER CENTER ONLY)
Abs Immature Granulocytes: 0.01 K/uL (ref 0.00–0.07)
Basophils Absolute: 0.1 K/uL (ref 0.0–0.1)
Basophils Relative: 1 %
Eosinophils Absolute: 0 K/uL (ref 0.0–0.5)
Eosinophils Relative: 1 %
HCT: 34.4 % — ABNORMAL LOW (ref 36.0–46.0)
Hemoglobin: 11.8 g/dL — ABNORMAL LOW (ref 12.0–15.0)
Immature Granulocytes: 0 %
Lymphocytes Relative: 41 %
Lymphs Abs: 1.7 K/uL (ref 0.7–4.0)
MCH: 28.4 pg (ref 26.0–34.0)
MCHC: 34.3 g/dL (ref 30.0–36.0)
MCV: 82.7 fL (ref 80.0–100.0)
Monocytes Absolute: 0.3 K/uL (ref 0.1–1.0)
Monocytes Relative: 8 %
Neutro Abs: 2.1 K/uL (ref 1.7–7.7)
Neutrophils Relative %: 49 %
Platelet Count: 269 K/uL (ref 150–400)
RBC: 4.16 MIL/uL (ref 3.87–5.11)
RDW: 12.5 % (ref 11.5–15.5)
WBC Count: 4.3 K/uL (ref 4.0–10.5)
nRBC: 0 % (ref 0.0–0.2)

## 2023-11-30 LAB — IRON AND IRON BINDING CAPACITY (CC-WL,HP ONLY)
Iron: 86 ug/dL (ref 28–170)
Saturation Ratios: 28 % (ref 10.4–31.8)
TIBC: 308 ug/dL (ref 250–450)
UIBC: 222 ug/dL (ref 148–442)

## 2023-11-30 LAB — FERRITIN: Ferritin: 175 ng/mL (ref 11–307)

## 2023-11-30 NOTE — Assessment & Plan Note (Addendum)
 Iron deficiency anemia likely secondary to recent miscarriage and bleeding during pregnancy.  Previous symptoms included low energy levels and cravings for ice chips, consistent with iron deficiency anemia.   On her consultation with us  on 07/27/2023, labs showed persistent anemia with hemoglobin of 10.3, hematocrit 31.3, MCV 73.  White count and platelet count are within normal limits.  CMP unremarkable.  Iron studies showed persistent iron deficiency with ferritin of 6, iron saturation 6%, increased iron binding capacity.  B12 and folate are within normal limits.   She was treated with IV iron with Feraheme x 2 doses at our infusion center on [address].    Labs today showed much improved hemoglobin of 11.8, MCV now normal at 82.7.  Iron studies currently show no evidence of iron deficiency.  Pica and fatigue have resolved.  Patient has not been taking any iron supplements.   She was advised to start taking OTC oral iron supplementation once daily or every other day with vitamin C to enhance absorption.  RTC in 6 months for follow-up with repeat labs.

## 2023-11-30 NOTE — Progress Notes (Signed)
 [facility]  HEMATOLOGY CLINIC PROGRESS NOTE  PATIENT NAME: Brianna Ali   MR#: [id number] DOB: 22-Aug-1985  Patient Care Team: Patient, No Pcp Per as PCP - General (General Practice)  Date of visit: 11/30/2023   ASSESSMENT & PLAN:   Brianna Ali is a 38 y.o. lady with a past medical history of sickle cell trait, was referred to our service in July 2025 for evaluation of microcytic anemia.  Found to have iron deficiency and was treated with IV iron using Feraheme x 2 doses.  Iron deficiency anemia Iron deficiency anemia likely secondary to recent miscarriage and bleeding during pregnancy.  Previous symptoms included low energy levels and cravings for ice chips, consistent with iron deficiency anemia.   On her consultation with us  on 07/27/2023, labs showed persistent anemia with hemoglobin of 10.3, hematocrit 31.3, MCV 73.  White count and platelet count are within normal limits.  CMP unremarkable.  Iron studies showed persistent iron deficiency with ferritin of 6, iron saturation 6%, increased iron binding capacity.  B12 and folate are within normal limits.   She was treated with IV iron with Feraheme x 2 doses at our infusion center on [address].    Labs today showed much improved hemoglobin of 11.8, MCV now normal at 82.7.  Iron studies currently show no evidence of iron deficiency.  Pica and fatigue have resolved.  Patient has not been taking any iron supplements.   She was advised to start taking OTC oral iron supplementation once daily or every other day with vitamin C to enhance absorption.  RTC in 6 months for follow-up with repeat labs.  Sickle cell trait Hemoglobin levels above 11 are considered adequate for individuals with sickle cell trait. - Continue monitoring hemoglobin levels.  I spent a total of 20 minutes during this encounter with the patient including review of chart and various tests results, discussions about plan of care and coordination  of care plan.  I reviewed lab results and outside records for this visit and discussed relevant results with the patient. Diagnosis, plan of care and treatment options were also discussed in detail with the patient. Opportunity provided to ask questions and answers provided to her apparent satisfaction. Provided instructions to call our clinic with any problems, questions or concerns prior to return visit. I recommended to continue follow-up with PCP and sub-specialists. She verbalized understanding and agreed with the plan. No barriers to learning was detected.  Chinita Patten, MD  11/30/2023 2:01 PM  [facility] - A DEPT OF [facility] [address] [place] [address] Dept: [phone] Dept Fax: [phone]   CHIEF COMPLAINT/ REASON FOR VISIT:  Follow-up for iron deficiency anemia, presumed to be from previous miscarriage and menstrual blood loss.  She also has sickle cell trait.  INTERVAL HISTORY:  Discussed the use of AI scribe software for clinical note transcription with the patient, who gave verbal consent to proceed.  History of Present Illness Brianna Ali is a 38 year old female with iron deficiency anemia who presents for follow-up of her anemia management.  Her hemoglobin levels have increased from 8.8 to 11.8 g/dL over four months following iron infusions. The mean corpuscular volume (MCV) of her red blood cells has normalized from 73 to 83 fL. She is currently awaiting the results of her iron levels from today's test.  She has decent energy levels and no longer experiences  cravings for ice chips, which she had previously. However, she experiences constipation and dark stools as side effects of oral iron supplements.  She has a history of sickle cell trait.  She recently experienced a miscarriage, and since then, her menstrual cycles have been heavier but shorter. Her current cycle started on Tuesday, with  a particularly heavy day on Wednesday, but it is now leveling out.    SUMMARY OF HEMATOLOGIC HISTORY:  She was referred by her OB GYN doctor for anemia. On 05/29/2023, labs showed hemoglobin of 8.8, hematocrit 26.7, MCV 80.  White count 9300 with normal differential.  Platelet count 212,000.   She has a history of anemia during her first pregnancy, which improved with iron supplements taken for approximately 30 days. She did not experience anemia during her second pregnancy. Recently, she had a miscarriage at [redacted] weeks gestation, preceded by persistent bleeding and preterm labor pains. She visited the hospital three times due to bleeding, which started as spotting and progressed to passing clots. Since the miscarriage, she has not experienced further bleeding issues. Workup for antiphospholipid antibody syndrome was negative.   She is currently taking iron supplements twice a day, although she sometimes forgets to take them. When taken, she experiences constipation as a side effect. Her recent blood work showed a hemoglobin level of 9 g/dL after starting iron supplements.   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 She has no history of heavy menstrual cycles, epistaxis, gum bleeds, or blood in stools.   She reports low energy levels and a strong craving for ice chips, which she describes as 'horrible'. She also mentions a craving to smell soap. No chest pain, trouble breathing, or unusual food cravings.  Iron deficiency anemia likely secondary to recent miscarriage and bleeding during pregnancy.  Previous symptoms included low energy levels and cravings for ice chips, consistent with iron deficiency anemia.   On her consultation with us  on 07/27/2023, labs showed persistent anemia with hemoglobin of 10.3, hematocrit 31.3, MCV 73.  White count and platelet count are within normal limits.  CMP unremarkable.  Iron studies showed persistent iron deficiency with  ferritin of 6, iron saturation 6%, increased iron binding capacity.  B12 and folate are within normal limits.   She was treated with IV iron with Feraheme x 2 doses at our infusion center on [address].     - Advise to continue oral iron supplementation once daily or every other day with vitamin C to enhance absorption.    I have reviewed the past medical history, past surgical history, social history and family history with the patient and they are unchanged from previous note.  ALLERGIES: She is allergic to ciprofloxacin and metronidazole .  MEDICATIONS:  No current outpatient medications on file.   No current facility-administered medications for this visit.     REVIEW OF SYSTEMS:    Review of Systems - Oncology  All other pertinent systems were reviewed with the patient and are negative.  PHYSICAL EXAMINATION:   Onc Performance Status - 11/30/23 1156       ECOG Perf Status   ECOG Perf Status Restricted in physically strenuous activity but ambulatory and able to carry out work of a light or sedentary nature, e.g., light house work, office work      KPS SCALE   KPS % SCORE Normal activity with effort, some s/s of disease          Vitals:  11/30/23 1147  BP: 108/73  Pulse: 65  Resp: 18  Temp: 98.1 F (36.7 C)  SpO2: 100%   Filed Weights   11/30/23 1147  Weight: 118 lb (53.5 kg)    Physical Exam Constitutional:      General: She is not in acute distress.    Appearance: Normal appearance.  HENT:     Head: Normocephalic and atraumatic.  Cardiovascular:     Rate and Rhythm: Normal rate.  Pulmonary:     Effort: Pulmonary effort is normal. No respiratory distress.  Abdominal:     General: There is no distension.  Neurological:     General: No focal deficit present.     Mental Status: She is alert and oriented to person, place, and time.  Psychiatric:        Mood and Affect: Mood normal.        Behavior: Behavior normal.     LABORATORY DATA:   I  have reviewed the data as listed.  Results for orders placed or performed in visit on 11/30/23  CBC with Differential (Cancer Center Only)  Result Value Ref Range   WBC Count 4.3 4.0 - 10.5 K/uL   RBC 4.16 3.87 - 5.11 MIL/uL   Hemoglobin 11.8 (L) 12.0 - 15.0 g/dL   HCT 65.5 (L) 63.9 - 53.9 %   MCV 82.7 80.0 - 100.0 fL   MCH 28.4 26.0 - 34.0 pg   MCHC 34.3 30.0 - 36.0 g/dL   RDW 87.4 88.4 - 84.4 %   Platelet Count 269 150 - 400 K/uL   nRBC 0.0 0.0 - 0.2 %   Neutrophils Relative % 49 %   Neutro Abs 2.1 1.7 - 7.7 K/uL   Lymphocytes Relative 41 %   Lymphs Abs 1.7 0.7 - 4.0 K/uL   Monocytes Relative 8 %   Monocytes Absolute 0.3 0.1 - 1.0 K/uL   Eosinophils Relative 1 %   Eosinophils Absolute 0.0 0.0 - 0.5 K/uL   Basophils Relative 1 %   Basophils Absolute 0.1 0.0 - 0.1 K/uL   Immature Granulocytes 0 %   Abs Immature Granulocytes 0.01 0.00 - 0.07 K/uL  Iron and Iron Binding Capacity ([facility] only)  Result Value Ref Range   Iron 86 28 - 170 ug/dL   TIBC 691 749 - 549 ug/dL   Saturation Ratios 28 10.4 - 31.8 %   UIBC 222 148 - 442 ug/dL  Ferritin  Result Value Ref Range   Ferritin 175 11 - 307 ng/mL    RADIOGRAPHIC STUDIES:  No recent pertinent imaging studies available to review.  Orders Placed This Encounter  Procedures   CBC with Differential (Cancer Center Only)    Standing Status:   Future    Expected Date:   05/29/2024    Expiration Date:   08/27/2024   Iron and Iron Binding Capacity ([facility] only)    Standing Status:   Future    Expected Date:   05/29/2024    Expiration Date:   08/27/2024   Ferritin    Standing Status:   Future    Expected Date:   05/29/2024    Expiration Date:   08/27/2024     Future Appointments  Date Time Provider Department Center  05/29/2024 11:00 AM [facility] None  05/29/2024 11:30 AM Videl Nobrega, Chinita, MD [facility] None     This document was completed utilizing speech recognition software. Grammatical errors, random word  insertions, pronoun errors, and incomplete sentences are an occasional consequence  of this system due to software limitations, ambient noise, and hardware issues. Any formal questions or concerns about the content, text or information contained within the body of this dictation should be directly addressed to the provider for clarification.

## 2023-12-06 DIAGNOSIS — Z419 Encounter for procedure for purposes other than remedying health state, unspecified: Secondary | ICD-10-CM | POA: Diagnosis not present

## 2024-05-29 ENCOUNTER — Inpatient Hospital Stay: Admitting: Oncology

## 2024-05-29 ENCOUNTER — Inpatient Hospital Stay
